# Patient Record
Sex: Female | Born: 1960
Health system: Southern US, Community
[De-identification: ages and names within clinical notes are randomized; demographics above are authoritative.]

## PROBLEM LIST (undated history)

## (undated) DIAGNOSIS — K219 Gastro-esophageal reflux disease without esophagitis: Secondary | ICD-10-CM

## (undated) DIAGNOSIS — R42 Dizziness and giddiness: Secondary | ICD-10-CM

## (undated) DIAGNOSIS — R51 Headache: Secondary | ICD-10-CM

## (undated) DIAGNOSIS — R519 Headache, unspecified: Secondary | ICD-10-CM

## (undated) HISTORY — PX: DILATION AND CURETTAGE OF UTERUS: SHX78

## (undated) HISTORY — DX: Headache: R51

## (undated) HISTORY — DX: Headache, unspecified: R51.9

## (undated) HISTORY — DX: Gastro-esophageal reflux disease without esophagitis: K21.9

---

## 1997-10-03 ENCOUNTER — Other Ambulatory Visit: Admission: RE | Admit: 1997-10-03 | Discharge: 1997-10-03 | Payer: Self-pay | Admitting: Obstetrics & Gynecology

## 1997-12-21 ENCOUNTER — Ambulatory Visit (HOSPITAL_COMMUNITY): Admission: RE | Admit: 1997-12-21 | Discharge: 1997-12-21 | Payer: Self-pay | Admitting: Emergency Medicine

## 1999-04-01 ENCOUNTER — Other Ambulatory Visit: Admission: RE | Admit: 1999-04-01 | Discharge: 1999-04-01 | Payer: Self-pay | Admitting: Obstetrics & Gynecology

## 2000-04-26 ENCOUNTER — Encounter: Admission: RE | Admit: 2000-04-26 | Discharge: 2000-04-26 | Payer: Self-pay | Admitting: Sports Medicine

## 2001-06-07 ENCOUNTER — Other Ambulatory Visit: Admission: RE | Admit: 2001-06-07 | Discharge: 2001-06-07 | Payer: Self-pay | Admitting: Obstetrics & Gynecology

## 2003-04-13 ENCOUNTER — Other Ambulatory Visit: Admission: RE | Admit: 2003-04-13 | Discharge: 2003-04-13 | Payer: Self-pay | Admitting: Obstetrics & Gynecology

## 2004-06-13 ENCOUNTER — Other Ambulatory Visit: Admission: RE | Admit: 2004-06-13 | Discharge: 2004-06-13 | Payer: Self-pay | Admitting: Obstetrics & Gynecology

## 2004-09-29 ENCOUNTER — Encounter: Admission: RE | Admit: 2004-09-29 | Discharge: 2004-09-29 | Payer: Self-pay | Admitting: Internal Medicine

## 2005-03-23 ENCOUNTER — Encounter: Admission: RE | Admit: 2005-03-23 | Discharge: 2005-03-23 | Payer: Self-pay | Admitting: Internal Medicine

## 2005-11-07 ENCOUNTER — Emergency Department (HOSPITAL_COMMUNITY): Admission: EM | Admit: 2005-11-07 | Discharge: 2005-11-07 | Payer: Self-pay | Admitting: Emergency Medicine

## 2007-06-24 ENCOUNTER — Encounter: Payer: Self-pay | Admitting: Family Medicine

## 2007-07-27 ENCOUNTER — Encounter: Payer: Self-pay | Admitting: Family Medicine

## 2010-09-22 ENCOUNTER — Other Ambulatory Visit: Payer: Self-pay | Admitting: Family Medicine

## 2010-09-22 ENCOUNTER — Ambulatory Visit
Admission: RE | Admit: 2010-09-22 | Discharge: 2010-09-22 | Disposition: A | Discharge status: Home / Self Care | Payer: 59 | Source: Ambulatory Visit | Attending: Family Medicine | Admitting: Family Medicine

## 2010-09-22 DIAGNOSIS — R42 Dizziness and giddiness: Secondary | ICD-10-CM

## 2010-09-26 ENCOUNTER — Other Ambulatory Visit: Payer: Self-pay | Admitting: Internal Medicine

## 2010-09-26 DIAGNOSIS — R42 Dizziness and giddiness: Secondary | ICD-10-CM

## 2010-09-29 ENCOUNTER — Ambulatory Visit
Admission: RE | Admit: 2010-09-29 | Discharge: 2010-09-29 | Disposition: A | Discharge status: Home / Self Care | Payer: 59 | Source: Ambulatory Visit | Attending: Internal Medicine | Admitting: Internal Medicine

## 2010-09-29 DIAGNOSIS — R42 Dizziness and giddiness: Secondary | ICD-10-CM

## 2010-09-29 MED ORDER — GADOBENATE DIMEGLUMINE 529 MG/ML IV SOLN
10.0000 mL | Freq: Once | INTRAVENOUS | Status: AC | PRN
  Administered 2010-09-29: 10 mL via INTRAVENOUS

## 2011-01-19 ENCOUNTER — Ambulatory Visit
Admission: RE | Admit: 2011-01-19 | Discharge: 2011-01-19 | Disposition: A | Discharge status: Home / Self Care | Payer: 59 | Source: Ambulatory Visit | Attending: Emergency Medicine | Admitting: Emergency Medicine

## 2011-01-19 ENCOUNTER — Other Ambulatory Visit: Payer: Self-pay | Admitting: Emergency Medicine

## 2011-01-19 DIAGNOSIS — R1011 Right upper quadrant pain: Secondary | ICD-10-CM

## 2011-01-29 ENCOUNTER — Other Ambulatory Visit: Payer: Self-pay | Admitting: Internal Medicine

## 2011-01-29 DIAGNOSIS — IMO0001 Reserved for inherently not codable concepts without codable children: Secondary | ICD-10-CM

## 2011-02-03 ENCOUNTER — Ambulatory Visit
Admission: RE | Admit: 2011-02-03 | Discharge: 2011-02-03 | Disposition: A | Discharge status: Home / Self Care | Payer: 59 | Source: Ambulatory Visit | Attending: Internal Medicine | Admitting: Internal Medicine

## 2011-02-03 DIAGNOSIS — IMO0001 Reserved for inherently not codable concepts without codable children: Secondary | ICD-10-CM

## 2011-11-08 ENCOUNTER — Ambulatory Visit (INDEPENDENT_AMBULATORY_CARE_PROVIDER_SITE_OTHER): Payer: Managed Care, Other (non HMO) | Admitting: Internal Medicine

## 2011-11-08 VITALS — BP 132/73 | HR 80 | Temp 99.3°F | Resp 16 | Ht 63.3 in | Wt 125.8 lb

## 2011-11-08 DIAGNOSIS — J309 Allergic rhinitis, unspecified: Secondary | ICD-10-CM | POA: Insufficient documentation

## 2011-11-08 DIAGNOSIS — J342 Deviated nasal septum: Secondary | ICD-10-CM | POA: Insufficient documentation

## 2011-11-08 DIAGNOSIS — Z780 Asymptomatic menopausal state: Secondary | ICD-10-CM | POA: Insufficient documentation

## 2011-11-08 DIAGNOSIS — R42 Dizziness and giddiness: Secondary | ICD-10-CM

## 2011-11-08 DIAGNOSIS — G4700 Insomnia, unspecified: Secondary | ICD-10-CM | POA: Insufficient documentation

## 2011-11-08 DIAGNOSIS — R5383 Other fatigue: Secondary | ICD-10-CM

## 2011-11-08 HISTORY — DX: Allergic rhinitis, unspecified: J30.9

## 2011-11-08 HISTORY — DX: Asymptomatic menopausal state: Z78.0

## 2011-11-08 HISTORY — DX: Deviated nasal septum: J34.2

## 2011-11-08 HISTORY — DX: Insomnia, unspecified: G47.00

## 2011-11-08 LAB — COMPREHENSIVE METABOLIC PANEL
ALT: 16 U/L (ref 0–35)
Albumin: 4.4 g/dL (ref 3.5–5.2)
BUN: 10 mg/dL (ref 6–23)
CO2: 29 mEq/L (ref 19–32)
Calcium: 9.5 mg/dL (ref 8.4–10.5)
Chloride: 104 mEq/L (ref 96–112)
Glucose, Bld: 103 mg/dL — ABNORMAL HIGH (ref 70–99)
Sodium: 141 mEq/L (ref 135–145)
Total Bilirubin: 0.4 mg/dL (ref 0.3–1.2)
Total Protein: 7 g/dL (ref 6.0–8.3)

## 2011-11-08 LAB — POCT URINALYSIS DIPSTICK
Bilirubin, UA: NEGATIVE
Blood, UA: NEGATIVE
Glucose, UA: NEGATIVE
Leukocytes, UA: NEGATIVE
Nitrite, UA: NEGATIVE
Protein, UA: NEGATIVE
Spec Grav, UA: 1.02
Urobilinogen, UA: 0.2
pH, UA: 7

## 2011-11-08 LAB — POCT UA - MICROSCOPIC ONLY
Bacteria, U Microscopic: NEGATIVE
Casts, Ur, LPF, POC: NEGATIVE
Crystals, Ur, HPF, POC: NEGATIVE
Yeast, UA: NEGATIVE

## 2011-11-08 LAB — POCT CBC
Granulocyte percent: 63.3 %G (ref 37–80)
HCT, POC: 42.8 % (ref 37.7–47.9)
Hemoglobin: 14.3 g/dL (ref 12.2–16.2)
Lymph, poc: 1.9 (ref 0.6–3.4)
MCH, POC: 30.2 pg (ref 27–31.2)
MCHC: 33.4 g/dL (ref 31.8–35.4)
MCV: 90.4 fL (ref 80–97)
MID (cbc): 0.3 (ref 0–0.9)
MPV: 8.7 fL (ref 0–99.8)
POC Granulocyte: 3.9 (ref 2–6.9)
POC LYMPH PERCENT: 31.4 %L (ref 10–50)
Platelet Count, POC: 220 10*3/uL (ref 142–424)
RBC: 4.73 M/uL (ref 4.04–5.48)
RDW, POC: 13.4 %
WBC: 6.1 10*3/uL (ref 4.6–10.2)

## 2011-11-08 LAB — POCT SEDIMENTATION RATE: POCT SED RATE: 20 mm/hr (ref 0–22)

## 2011-11-08 LAB — TSH: TSH: 1.119 u[IU]/mL (ref 0.350–4.500)

## 2011-11-08 MED ORDER — FLUTICASONE PROPIONATE 50 MCG/ACT NA SUSP: 2.0000 | Freq: Every day | NASAL | Status: DC

## 2011-11-08 NOTE — Patient Instructions (Signed)
Where did I leave my glasses Afrin on R-5d Then Flonase at least a month Valerian root 450-600 at bedtime???

## 2011-11-08 NOTE — Progress Notes (Signed)
Subjective:    Patient ID: Dana [name], female    DOB: 07/24/1960, 51 y.o.   MRN: [id number]  HPIFatigue for several months/this has been cyclic for years Dizzy spells-On and off for 4 weeks/Some episodes start doing exercise which is minimal Hot/sweats-Internet at 6 weeks Ears pop Nasal obstruction In a fog-When wakes up Falling asleep early and Sleeps poorly/Waking frequently and early Mild daytime somnolence Dr Dana [name] testing/hearing/"sounds" in ear-all thought WNL palpitations Hot and cold for years-more recently No sweating  Review of Systems  Constitutional: Positive for appetite change. Negative for fever, diaphoresis, activity change and unexpected weight change.  HENT: Negative for hearing loss, congestion, rhinorrhea, sneezing, trouble swallowing and postnasal drip.   Eyes: Negative for photophobia and visual disturbance.  Respiratory: Negative for apnea, cough, chest tightness and wheezing.   Cardiovascular: Positive for palpitations. Negative for chest pain and leg swelling.  Gastrointestinal: Negative for nausea, vomiting, abdominal pain, diarrhea and constipation.  Genitourinary: Negative for dysuria, frequency and difficulty urinating.  Musculoskeletal: Negative for back pain, joint swelling, arthralgias and gait problem.  Skin: Negative for rash.  Neurological: Positive for dizziness and headaches. Negative for weakness and numbness.  Hematological: Negative for adenopathy. Does not bruise/bleed easily.  Psychiatric/Behavioral: Positive for disturbed wake/sleep cycle. Negative for behavioral problems, dysphoric mood and agitation.   Ha as in past/minimal opthal wnl Neck tender--PT O'Halloran    Objective:   Physical Exam  Constitutional: She is oriented to person, place, and time. She appears well-developed and well-nourished.  HENT:  Right Ear: External ear normal.  Left Ear: External ear normal.  Nose: Nose normal.  Mouth/Throat: Oropharynx  is clear and moist.       TMs clear/canals clear  Right sided septal deviation with obstruction/no purulent discharge  Eyes: Conjunctivae and EOM are normal. Pupils are equal, round, and reactive to light.  Neck: Normal range of motion. Neck supple. No thyromegaly present.  Cardiovascular: Normal rate, regular rhythm, normal heart sounds and intact distal pulses.   No murmur heard. Pulmonary/Chest: Effort normal and breath sounds normal.  Abdominal: Soft. Bowel sounds are normal. She exhibits no mass. There is no tenderness.       No organomegaly  Musculoskeletal: Normal range of motion. She exhibits no edema.  Lymphadenopathy:    She has no cervical adenopathy.  Neurological: She is alert and oriented to person, place, and time. She has normal reflexes.  Skin: No rash noted. No pallor.  Psychiatric: She has a normal mood and affect. Her behavior is normal. Judgment and thought content normal.       Denies anxiety/ depression          Results for orders placed in visit on 11/08/11  POCT CBC      Component Value Range   WBC 6.1  4.6 - 10.2 K/uL   Lymph, poc 1.9  0.6 - 3.4   POC LYMPH PERCENT 31.4  10 - 50 %L   MID (cbc) 0.3  0 - 0.9   POC MID % 5.3  0 - 12 %M   POC Granulocyte 3.9  2 - 6.9   Granulocyte percent 63.3  37 - 80 %G   RBC 4.73  4.04 - 5.48 M/uL   Hemoglobin 14.3  12.2 - 16.2 g/dL   HCT, POC 82.9  56.2 - 47.9 %   MCV 90.4  80 - 97 fL   MCH, POC 30.2  27 - 31.2 pg   MCHC 33.4  31.8 - 35.4 g/dL  RDW, POC 13.4     Platelet Count, POC 220  142 - 424 K/uL   MPV 8.7  0 - 99.8 fL  POCT URINALYSIS DIPSTICK      Component Value Range   Color, UA yellow     Clarity, UA clear     Glucose, UA neg     Bilirubin, UA neg     Ketones, UA neg     Spec Grav, UA 1.020     Blood, UA neg     pH, UA 7.0     Protein, UA neg     Urobilinogen, UA 0.2     Nitrite, UA neg     Leukocytes, UA Negative    POCT UA - MICROSCOPIC ONLY      Component Value Range   WBC, Ur, HPF,  POC neg     RBC, urine, microscopic neg     Bacteria, U Microscopic neg     Mucus, UA neg     Epithelial cells, urine per micros 0-3     Crystals, Ur, HPF, POC neg     Casts, Ur, LPF, POC neg     Yeast, UA neg      Assessment & Plan:   1. Fatigue  POCT CBC, POCT SEDIMENTATION RATE, Comprehensive metabolic panel, Vitamin D, 25-hydroxy, TSH, POCT urinalysis dipstick, POCT UA - Microscopic Only  2. Dizzy  POCT CBC  3. Nasal septal deviation    4. Insomnia -Poor sleep   5. AR (allergic rhinitis)    6.  Vitamin D deficiency in the past  Consider chronic sleep deprivation/dietary insufficiency/some type of chronic fatigue syndrome Trial of Flonase to see if we can improve breathing at night Begin gentle exercise program at home Patient Instructions  Where did I leave my glasses Afrin on R-5d Then Flonase at least a month Valerian root 450-600 at bedtime???

## 2011-11-09 ENCOUNTER — Telehealth: Payer: Self-pay | Admitting: Internal Medicine

## 2011-11-09 LAB — VITAMIN D 25 HYDROXY (VIT D DEFICIENCY, FRACTURES): Vit D, 25-Hydroxy: 40 ng/mL (ref 30–89)

## 2011-11-09 NOTE — Telephone Encounter (Signed)
Discussed normal labs and proceeding with the plan as outlined Will consider medication if sleep problems cannot be solved She will attend the seminar on dizziness

## 2012-04-17 ENCOUNTER — Encounter: Payer: Self-pay | Admitting: Family Medicine

## 2012-04-28 ENCOUNTER — Observation Stay (HOSPITAL_COMMUNITY)
Admission: EM | Admit: 2012-04-28 | Discharge: 2012-04-28 | Disposition: A | Discharge status: Home / Self Care | Payer: 59 | Attending: Emergency Medicine | Admitting: Emergency Medicine

## 2012-04-28 ENCOUNTER — Encounter (HOSPITAL_COMMUNITY): Payer: Self-pay | Admitting: Emergency Medicine

## 2012-04-28 ENCOUNTER — Observation Stay (HOSPITAL_COMMUNITY): Payer: 59

## 2012-04-28 DIAGNOSIS — R1110 Vomiting, unspecified: Secondary | ICD-10-CM | POA: Insufficient documentation

## 2012-04-28 DIAGNOSIS — R42 Dizziness and giddiness: Principal | ICD-10-CM | POA: Insufficient documentation

## 2012-04-28 HISTORY — DX: Dizziness and giddiness: R42

## 2012-04-28 LAB — CBC WITH DIFFERENTIAL/PLATELET
Basophils Absolute: 0 10*3/uL (ref 0.0–0.1)
Eosinophils Absolute: 0.1 10*3/uL (ref 0.0–0.7)
Eosinophils Relative: 1 % (ref 0–5)
HCT: 37.1 % (ref 36.0–46.0)
Hemoglobin: 13.4 g/dL (ref 12.0–15.0)
Lymphocytes Relative: 21 % (ref 12–46)
Lymphs Abs: 1.1 10*3/uL (ref 0.7–4.0)
MCH: 31 pg (ref 26.0–34.0)
MCHC: 36.1 g/dL — ABNORMAL HIGH (ref 30.0–36.0)
MCV: 85.9 fL (ref 78.0–100.0)
Neutro Abs: 4.1 10*3/uL (ref 1.7–7.7)
Neutrophils Relative %: 73 % (ref 43–77)
Platelets: 158 10*3/uL (ref 150–400)
RBC: 4.32 MIL/uL (ref 3.87–5.11)
RDW: 12.4 % (ref 11.5–15.5)
WBC: 5.6 10*3/uL (ref 4.0–10.5)

## 2012-04-28 LAB — COMPREHENSIVE METABOLIC PANEL
ALT: 17 U/L (ref 0–35)
AST: 22 U/L (ref 0–37)
Albumin: 3.8 g/dL (ref 3.5–5.2)
Alkaline Phosphatase: 56 U/L (ref 39–117)
BUN: 13 mg/dL (ref 6–23)
CO2: 27 mEq/L (ref 19–32)
Calcium: 9.7 mg/dL (ref 8.4–10.5)
Chloride: 101 mEq/L (ref 96–112)
Creatinine, Ser: 0.76 mg/dL (ref 0.50–1.10)
GFR calc Af Amer: >90 mL/min (ref >90)
GFR calc non Af Amer: >90 mL/min (ref >90)
Glucose, Bld: 124 mg/dL — ABNORMAL HIGH (ref 70–99)
Potassium: 3.8 mEq/L (ref 3.5–5.1)
Sodium: 137 mEq/L (ref 135–145)
Total Bilirubin: 0.3 mg/dL (ref 0.3–1.2)
Total Protein: 7.1 g/dL (ref 6.0–8.3)

## 2012-04-28 LAB — RAPID URINE DRUG SCREEN, HOSP PERFORMED
Amphetamines: NOT DETECTED
Barbiturates: NOT DETECTED
Benzodiazepines: NOT DETECTED
Cocaine: NOT DETECTED
Opiates: NOT DETECTED

## 2012-04-28 MED ORDER — LORAZEPAM 2 MG/ML IJ SOLN
1.0000 mg | Freq: Once | INTRAMUSCULAR | Status: DC
  Filled 2012-04-28: qty 1

## 2012-04-28 MED ORDER — MECLIZINE HCL 50 MG PO TABS: 50.0000 mg | ORAL_TABLET | Freq: Three times a day (TID) | ORAL | Status: DC | PRN

## 2012-04-28 MED ORDER — ONDANSETRON HCL 4 MG/2ML IJ SOLN
4.0000 mg | Freq: Once | INTRAMUSCULAR | Status: AC
  Administered 2012-04-28: 4 mg via INTRAVENOUS
  Filled 2012-04-28: qty 2

## 2012-04-28 MED ORDER — SODIUM CHLORIDE 0.9 % IV BOLUS (SEPSIS)
1000.0000 mL | Freq: Once | INTRAVENOUS | Status: AC
  Administered 2012-04-28: 1000 mL via INTRAVENOUS

## 2012-04-28 MED ORDER — MECLIZINE HCL 25 MG PO TABS
25.0000 mg | ORAL_TABLET | Freq: Once | ORAL | Status: AC
  Administered 2012-04-28: 25 mg via ORAL
  Filled 2012-04-28: qty 1

## 2012-04-28 NOTE — ED Notes (Signed)
Report given to RN [facility]

## 2012-04-28 NOTE — ED Notes (Signed)
Pt aware of need for urine sample.  Neuro PA at bedside.

## 2012-04-28 NOTE — ED Notes (Signed)
Pt unsure of taking Ativan.  PA notified.  MD at bedside.

## 2012-04-28 NOTE — ED Notes (Signed)
Pt A&Ox4, ambulatory, nad. Pt c/o lightheadedness, but is able to ambulate w/o difficulty.

## 2012-04-28 NOTE — ED Notes (Signed)
Provider at bedside

## 2012-04-28 NOTE — ED Notes (Signed)
Neuro MD continues at bedside.

## 2012-04-28 NOTE — ED Provider Notes (Signed)
Medical screening examination/treatment/procedure(s) were conducted as a shared visit with non-physician practitioner(s) and myself.  I personally evaluated the patient during the encounter.  Nsurgery consulted for dizziness, vertigo - concerns for possible TIA/stroke. She had MRI earlier, that was negative. I dont see any reason to get MRI right now, however, with persistent sx of dizziness, with normal neuro exam, no risk factors - we will get Neuro on board for their recs rather than get MRI right off.  Derwood Kaplan, MD 04/28/12 [phone]

## 2012-04-28 NOTE — ED Provider Notes (Signed)
History     CSN: [id number]  Arrival date & time 04/28/12  5621   First MD Initiated Contact with Patient 04/28/12 1222      Chief Complaint  Patient presents with  . Dizziness  . Emesis    (Consider location/radiation/quality/duration/timing/severity/associated sxs/prior treatment) HPI  51 year old female with history of vertigo, status post negative brain MRI in 2012 presents complaining of dizziness.  Pt reports when she got up and get out of bed this AM she experienced acute onset of dizziness (room spinning around), which worsen when she turned her head.  She fell, and hit R forearm against the wall.  Did not hits head of LOC.  Sxs lasting < 30 min and resolved.  She proceed to take a shower and after getting out of the the shower she once again experienced dizziness, especially as she lays in her bed.  She then decided to come to ER for further care.  While in the waiting room, she felt nauseated and proceed to vomit once, having difficulty standing.  She endorse a mild headache but sts she has hx of migraine and this headache is the same.  Otherwise denies fever, chills, neck stiffness, cp, sob, abd pain, numbness or weakness.  Denies recent sickness, medication changes, ear pain, hearing changes.  No treatment tried.    Past Medical History  Diagnosis Date  . Vertigo     No past surgical history on file.  No family history on file.  History  Substance Use Topics  . Smoking status: Never Smoker   . Smokeless tobacco: Not on file  . Alcohol Use: Not on file    OB History    Grav Para Term Preterm Abortions TAB SAB Ect Mult Living                  Review of Systems  All other systems reviewed and are negative.    Allergies  Prednisone  Home Medications   Current Outpatient Rx  Name  Route  Sig  Dispense  Refill  . FLUTICASONE PROPIONATE 50 MCG/ACT NA SUSP   Nasal   Place 2 sprays into the nose daily.   16 g   6   . LORATADINE 10 MG PO TABS   Oral  Take 10 mg by mouth daily.           BP 108/67  Pulse 70  Temp 98 F (36.7 C) (Oral)  Resp 18  SpO2 99%  Physical Exam  Nursing note and vitals reviewed. Constitutional: She is oriented to person, place, and time. She appears well-developed and well-nourished. No distress.       Awake, alert, nontoxic appearance  HENT:  Head: Normocephalic and atraumatic.  Nose: Nose normal.  Mouth/Throat: Oropharynx is clear and moist. No oropharyngeal exudate.       TM appears dull bilaterally without overt signs of infection.    Eyes: Conjunctivae normal and EOM are normal. Pupils are equal, round, and reactive to light. Right eye exhibits no discharge. Left eye exhibits no discharge.       Fatigable horizontal nystagmus favoring the left side.    Neck: Normal range of motion. Neck supple.  Cardiovascular: Normal rate and regular rhythm.  Exam reveals no gallop and no friction rub.   No murmur heard. Pulmonary/Chest: Effort normal. No respiratory distress. She exhibits no tenderness.  Abdominal: Soft. There is no tenderness. There is no rebound.  Musculoskeletal: Normal range of motion. She exhibits no edema and no tenderness.  ROM appears intact, no obvious focal weakness  Neurological: She is alert and oriented to person, place, and time. She has normal strength and normal reflexes. She displays normal reflexes. No cranial nerve deficit. She exhibits normal muscle tone. Coordination normal. GCS eye subscore is 4. GCS verbal subscore is 5. GCS motor subscore is 6.       Mental status and motor strength appears intact.  Normal gait.  Normal finger to nose, heel shin, Romberg negative.  Skin: Skin is warm. No rash noted.  Psychiatric: She has a normal mood and affect.    ED Course  Procedures (including critical care time)  Labs Reviewed  CBC WITH DIFFERENTIAL - Abnormal; Notable for the following:    MCHC 36.1 (*)     All other components within normal limits  COMPREHENSIVE  METABOLIC PANEL - Abnormal; Notable for the following:    Glucose, Bld 124 (*)     All other components within normal limits   Results for orders placed during the hospital encounter of 04/28/12  CBC WITH DIFFERENTIAL      Component Value Range   WBC 5.6  4.0 - 10.5 K/uL   RBC 4.32  3.87 - 5.11 MIL/uL   Hemoglobin 13.4  12.0 - 15.0 g/dL   HCT 40.9  81.1 - 91.4 %   MCV 85.9  78.0 - 100.0 fL   MCH 31.0  26.0 - 34.0 pg   MCHC 36.1 (*) 30.0 - 36.0 g/dL   RDW 78.2  95.6 - 21.3 %   Platelets 158  150 - 400 K/uL   Neutrophils Relative 73  43 - 77 %   Neutro Abs 4.1  1.7 - 7.7 K/uL   Lymphocytes Relative 21  12 - 46 %   Lymphs Abs 1.1  0.7 - 4.0 K/uL   Monocytes Relative 5  3 - 12 %   Monocytes Absolute 0.3  0.1 - 1.0 K/uL   Eosinophils Relative 1  0 - 5 %   Eosinophils Absolute 0.1  0.0 - 0.7 K/uL   Basophils Relative 1  0 - 1 %   Basophils Absolute 0.0  0.0 - 0.1 K/uL  COMPREHENSIVE METABOLIC PANEL      Component Value Range   Sodium 137  135 - 145 mEq/L   Potassium 3.8  3.5 - 5.1 mEq/L   Chloride 101  96 - 112 mEq/L   CO2 27  19 - 32 mEq/L   Glucose, Bld 124 (*) 70 - 99 mg/dL   BUN 13  6 - 23 mg/dL   Creatinine, Ser 0.86  0.50 - 1.10 mg/dL   Calcium 9.7  8.4 - 57.8 mg/dL   Total Protein 7.1  6.0 - 8.3 g/dL   Albumin 3.8  3.5 - 5.2 g/dL   AST 22  0 - 37 U/L   ALT 17  0 - 35 U/L   Alkaline Phosphatase 56  39 - 117 U/L   Total Bilirubin 0.3  0.3 - 1.2 mg/dL   GFR calc non Af Amer >90  >90 mL/min   GFR calc Af Amer >90  >90 mL/min   No results found.  1. dizziness  MDM  Pt presents with vertigo suggestive of BPPV.  Currently without significant dizziness but endorse nausea.  Meclizine given.  She has no focal neuro deficits.  In the setting of a normal brain MRI last year, my suspicion for central vertigo is low.    Attempt of dix-hallpike and appley maneuver without relief  of symptom.    2:06 PM Pt reports no improvement after treatment with meclizine. Ativan IV given,  will continue to monitor.    2:53 PM My attending has evaluated pt and recommend consult neurology for further care.    3:15 PM Neurology, Dr. Thad Ranger has been consulted and agrees to see pt in ED for further management.  Pt aware of plan.  Pt currently resting without acute distress.  Family at bedside.    4:45 PM Pt move to CDU while awaiting for neuro consult.  If no further testing, pt may benefit from meclizine and ENT f/u.  Care discussed with CDU PA who will continue care.  My attending is aware.   BP 108/67  Pulse 76  Temp 98 F (36.7 C) (Oral)  Resp 18  SpO2 99%  I have reviewed nursing notes and vital signs. I personally reviewed the imaging tests through PACS system  I reviewed available ER/hospitalization records thought the EMR\    Fayrene Helper, PA-C 04/28/12 1646

## 2012-04-28 NOTE — ED Provider Notes (Signed)
Medical screening examination/treatment/procedure(s) were performed by non-physician practitioner and as supervising physician I was immediately available for consultation/collaboration.   Sarkis Rhines, MD 04/28/12 2328 

## 2012-04-28 NOTE — ED Provider Notes (Signed)
Pt transferred to the CDU awaiting evaluation by Dr Thad Ranger.  Pt c/o dizziness with horizontal nystagmus on exam. She had MRI earlier, that was negative.  She was evaluated by the neurology PA who is going to have Dr Thad Ranger evaluate.  Plan: Nsurgery consulted for dizziness, vertigo - concerns for possible TIA/stroke by Dr Derwood Kaplan and with persistent sx of dizziness, with normal neuro exam, no risk factors - we will get Neuro on board for their recs rather than get MRI immediatly.  BP 130/68  Pulse 95  Temp 98 F (36.7 C) (Oral)  Resp 18  SpO2 99%   On exam: hemodynamically stable, NAD, heart w/ RRR, lungs CTAB, Chest & abd non-tender, no peripheral edema or calf tenderness.  Pt continuing to c/o dizziness.  Given meclazine without relief.    7:00 PM  Recommendations from Dr Thad Ranger:  1. MRI/A of the brain without contrast  2. If above unremarkable may be discharged to home with Meclizine to use prn. Otherwise appropriate treatment to be initiated.    8:45 PM  MRI: Without acute intracranial hemorrhage, hydrocephalus or acute infarct MRA: No medium or large sized vessel significant stenosis or occlusion  Based on recommendations from Dr. Thad Ranger will discharge home with meclizine to use when necessary and have her followup outpatient.  Patient ambulates in the department without difficulty.  1. Medications: Meclizine, usual home medications 2. Treatment: rest, drink plenty of fluids, take medications as prescribed 3. Follow Up: Please followup with your primary doctor for discussion of your diagnoses and further evaluation after today's visit; if you do not have a primary care doctor use the resource guide provided to find one; followup with neurology about today's symptoms   Dierdre Forth, PA-C 04/28/12 2324

## 2012-04-28 NOTE — ED Notes (Signed)
MD Reynolds-Neuro MD at bedside.

## 2012-04-28 NOTE — ED Notes (Signed)
Pt ambulated to and from RR with husband.  Pt reports feeling no better.  PA notified.

## 2012-04-28 NOTE — ED Notes (Signed)
Pt reports dizziness this AM upon waking. Pt also reports after getting out of the shower she felt hot and was spinning. States just vomited in waiting room. States she feels unstable while standing up.

## 2012-04-28 NOTE — Consult Note (Signed)
NEURO HOSPITALIST CONSULT NOTE    Reason for Consult: vertigo  HPI:                                                                                                                                          Dana [name] is an 51 y.o. female who has history of syncope, nasal obstruction, palpitations who has been seen 2 years ago by Dr. Sandria Manly and Dr. Annalee Genta (ENT) for vertigo.  At that time she sates she had a MRI of her brain which showed small white matter lesions on in the right frontal and temporal lobes and likely chronic.  no stroke noted at that time. Per patient, echo was also performed which was normal. She was also seen by ENT who did not find any etiology for her symptoms. She was placed on no medication and has been doing fairly well until this AM when she had gotten up and walked downstairs.  She turned to the right and had a episode in which she lost balance and had to lean up to the wall on the right. This quickly resolved.  She later took a hot shower and then walked to her bed.  She laid down for a brief moment.  She had a sudden onset of acute left to right vertigo, flushed and hot sensation with nausea that lasted for a few minutes.  After this cleared she had no further vertigo but also did not feel back to her baseline. At present time she still feels "off" but has no vertigo.  She was given Meclizine and Ativan since in ED--she feels this may have worsened her "off" sensation.  Patient denies any paresthesia, neck pain, lateralizing weakness.   Of note: over last week she has noted a rushing sound in her left ear. Patient also states she often will have dizziness when she stands up quickly but not usually associated with vertigo.   Past Medical History  Diagnosis Date  . Vertigo     No past surgical history on file.  No family history on file.  Family History: Mother HTN, High cholesterol, depression Father CAD, HTN, Hyperlipidemia.  Social History:   she has never smoked. She does not have any smokeless tobacco history on file. Her alcohol and drug histories not on file.  Allergies  Allergen Reactions  . Prednisone     Feels like skin crawls    MEDICATIONS:  Current Facility-Administered Medications  Medication Dose Route Frequency Provider Last Rate Last Dose  . LORazepam (ATIVAN) injection 1 mg  1 mg Intravenous Once Fayrene Helper, PA-C      . [COMPLETED] meclizine (ANTIVERT) tablet 25 mg  25 mg Oral Once Fayrene Helper, PA-C   25 mg at 04/28/12 1242  . [COMPLETED] ondansetron (ZOFRAN) injection 4 mg  4 mg Intravenous Once Fayrene Helper, PA-C   4 mg at 04/28/12 1302  . [COMPLETED] sodium chloride 0.9 % bolus 1,000 mL  1,000 mL Intravenous Once Fayrene Helper, PA-C   1,000 mL at 04/28/12 1302   Current Outpatient Prescriptions  Medication Sig Dispense Refill  . loratadine (CLARITIN) 10 MG tablet Take 10 mg by mouth daily.         ROS:                                                                                                                                       History obtained from the patient  General ROS: negative for - chills, fatigue, fever, night sweats, weight gain or weight loss Psychological ROS: negative for - behavioral disorder, hallucinations, memory difficulties, mood swings or suicidal ideation Ophthalmic ROS: negative for - blurry vision, double vision, eye pain or loss of vision ENT ROS: positive for -  tinnitus or vertigo Allergy and Immunology ROS: negative for - hives or itchy/watery eyes Hematological and Lymphatic ROS: negative for - bleeding problems, bruising or swollen lymph nodes Endocrine ROS: negative for - galactorrhea, hair pattern changes, polydipsia/polyuria or temperature intolerance Respiratory ROS: negative for - cough, hemoptysis, shortness of breath or wheezing Cardiovascular ROS:  negative for - chest pain, dyspnea on exertion, edema or irregular heartbeat Gastrointestinal ROS: negative for - abdominal pain, diarrhea, hematemesis, nausea/vomiting or stool incontinence Genito-Urinary ROS: negative for - dysuria, hematuria, incontinence or urinary frequency/urgency Musculoskeletal ROS: negative for - joint swelling or muscular weakness Neurological ROS: as noted in HPI Dermatological ROS: negative for rash and skin lesion changes   Blood pressure 108/67, pulse 76, temperature 98 F (36.7 C), temperature source Oral, resp. rate 18, SpO2 99.00%.   Neurologic Examination:                                                                                                      Mental Status: Alert, oriented, thought content appropriate.  Speech fluent without evidence of aphasia.  Able to follow 3 step commands without difficulty. Cranial Nerves: II: Discs flat bilaterally; Visual fields grossly  normal, pupils equal, round, reactive to light and accommodation III,IV, VI: ptosis not present, extra-ocular motions intact bilaterally.  Nystagmus bilaterally which is horizontal. V,VII: smile symmetric, facial light touch sensation normal bilaterally VIII: hearing normal bilaterally IX,X: gag reflex present XI: bilateral shoulder shrug XII: midline tongue extension Motor: Right : Upper extremity   5/5    Left:     Upper extremity   5/5  Lower extremity   5/5     Lower extremity   5/5 Tone and bulk:normal tone throughout; no atrophy noted Sensory: Pinprick and light touch intact throughout, bilaterally Deep Tendon Reflexes: 2+ and symmetric throughout Plantars: Right: downgoing   Left: downgoing Cerebellar: normal finger-to-nose,  normal heel-to-shin test  Gait: HSH normal, walking on toes normal, heel walking normal.  On standing becomes acutely off balance CV: pulses palpable throughout      No results found for this basename: cbc, bmp, coags, chol, tri, ldl, hga1c     Results for orders placed during the hospital encounter of 04/28/12 (from the past 48 hour(s))  CBC WITH DIFFERENTIAL     Status: Abnormal   Collection Time   04/28/12 10:27 AM      Component Value Range Comment   WBC 5.6  4.0 - 10.5 K/uL    RBC 4.32  3.87 - 5.11 MIL/uL    Hemoglobin 13.4  12.0 - 15.0 g/dL    HCT 40.9  81.1 - 91.4 %    MCV 85.9  78.0 - 100.0 fL    MCH 31.0  26.0 - 34.0 pg    MCHC 36.1 (*) 30.0 - 36.0 g/dL    RDW 78.2  95.6 - 21.3 %    Platelets 158  150 - 400 K/uL    Neutrophils Relative 73  43 - 77 %    Neutro Abs 4.1  1.7 - 7.7 K/uL    Lymphocytes Relative 21  12 - 46 %    Lymphs Abs 1.1  0.7 - 4.0 K/uL    Monocytes Relative 5  3 - 12 %    Monocytes Absolute 0.3  0.1 - 1.0 K/uL    Eosinophils Relative 1  0 - 5 %    Eosinophils Absolute 0.1  0.0 - 0.7 K/uL    Basophils Relative 1  0 - 1 %    Basophils Absolute 0.0  0.0 - 0.1 K/uL   COMPREHENSIVE METABOLIC PANEL     Status: Abnormal   Collection Time   04/28/12 10:27 AM      Component Value Range Comment   Sodium 137  135 - 145 mEq/L    Potassium 3.8  3.5 - 5.1 mEq/L    Chloride 101  96 - 112 mEq/L    CO2 27  19 - 32 mEq/L    Glucose, Bld 124 (*) 70 - 99 mg/dL    BUN 13  6 - 23 mg/dL    Creatinine, Ser 0.86  0.50 - 1.10 mg/dL    Calcium 9.7  8.4 - 57.8 mg/dL    Total Protein 7.1  6.0 - 8.3 g/dL    Albumin 3.8  3.5 - 5.2 g/dL    AST 22  0 - 37 U/L    ALT 17  0 - 35 U/L    Alkaline Phosphatase 56  39 - 117 U/L    Total Bilirubin 0.3  0.3 - 1.2 mg/dL    GFR calc non Af Amer >90  >90 mL/min    GFR calc Af Amer >  90  >90 mL/min     Felicie Morn PA-C [facility] [phone]  04/28/2012, 4:12 PM Patient seen and examined.  Clinical course and management discussed.  Necessary edits performed.  I agree with the above.  Assessment and plan of care developed and discussed below.     Assessment/Plan: 51 year old female with no risk factors for stroke who presents with vertigo and vomiting.   Symptoms have for the most part resolved but patient still with nystagmus and off balance to some degree with walking.  Complains of symptoms starting with an occipital headache.  Would rule out posterior circulation stroke vs. Dissection.  Patient has had BPV in the past cut current symptoms are very different.  Recommendations: 1. MRI/A of the brain without contrast 2. If above unremarkable may be discharged to home with Meclizine to use prn.  Otherwise appropriate treatment to be initiated.    Case discussed with PA  Thana Farr, MD [facility] [phone]  04/28/2012  6:45 PM

## 2012-12-22 ENCOUNTER — Other Ambulatory Visit: Payer: Self-pay | Admitting: Internal Medicine

## 2012-12-22 DIAGNOSIS — R1013 Epigastric pain: Secondary | ICD-10-CM

## 2012-12-22 MED ORDER — DICYCLOMINE HCL 10 MG PO CAPS: 10.0000 mg | ORAL_CAPSULE | Freq: Three times a day (TID) | ORAL | Status: DC

## 2012-12-22 NOTE — Progress Notes (Signed)
[place]---reflux nausea bloating p meals -no chg w/ nexium 10d Happened 18mos ago and took 2 months to get well  Plan--add to nexium Meds ordered this encounter  Medications  . dicyclomine (BENTYL) 10 MG capsule    Sig: Take 1 capsule (10 mg total) by mouth 4 (four) times daily -  before meals and at bedtime.    Dispense:  30 capsule    Refill:  0  f/u 2-3 weeks if not well

## 2013-01-04 ENCOUNTER — Encounter: Payer: Self-pay | Admitting: Internal Medicine

## 2013-01-04 ENCOUNTER — Ambulatory Visit (INDEPENDENT_AMBULATORY_CARE_PROVIDER_SITE_OTHER): Payer: 59 | Admitting: Internal Medicine

## 2013-01-04 VITALS — BP 119/73 | HR 80 | Temp 97.4°F | Resp 16 | Ht 63.5 in | Wt 124.0 lb

## 2013-01-04 DIAGNOSIS — G8929 Other chronic pain: Secondary | ICD-10-CM

## 2013-01-04 DIAGNOSIS — R1013 Epigastric pain: Secondary | ICD-10-CM

## 2013-01-04 DIAGNOSIS — R630 Anorexia: Secondary | ICD-10-CM

## 2013-01-04 LAB — COMPREHENSIVE METABOLIC PANEL
BUN: 9 mg/dL (ref 6–23)
CO2: 28 mEq/L (ref 19–32)
Creat: 0.76 mg/dL (ref 0.50–1.10)
Glucose, Bld: 102 mg/dL — ABNORMAL HIGH (ref 70–99)
Total Bilirubin: 0.7 mg/dL (ref 0.3–1.2)
Total Protein: 7.3 g/dL (ref 6.0–8.3)

## 2013-01-04 LAB — CBC WITH DIFFERENTIAL/PLATELET
Eosinophils Absolute: 0 10*3/uL (ref 0.0–0.7)
Eosinophils Relative: 1 % (ref 0–5)
Hemoglobin: 14 g/dL (ref 12.0–15.0)
Lymphs Abs: 1.5 10*3/uL (ref 0.7–4.0)
MCH: 30.7 pg (ref 26.0–34.0)
MCV: 83.8 fL (ref 78.0–100.0)
Monocytes Relative: 8 % (ref 3–12)
RBC: 4.56 MIL/uL (ref 3.87–5.11)

## 2013-01-04 LAB — POCT SEDIMENTATION RATE: POCT SED RATE: 17 mm/hr (ref 0–22)

## 2013-01-04 MED ORDER — ESOMEPRAZOLE MAGNESIUM 40 MG PO CPDR: 40.0000 mg | DELAYED_RELEASE_CAPSULE | Freq: Every day | ORAL | Status: DC

## 2013-01-04 NOTE — Progress Notes (Signed)
Subjective:    Patient ID: Dana [name], female    DOB: 1960/07/22, 52 y.o.   MRN: [id number]  HPI 6 weeks ago she ingested spicy chili and ever since has had problems with loss of appetite, burping frequently, inability to eat without increasing symptoms, epigastric discomfort without burning or nocturnal symptoms, and a general feeling of disease with regard to her abdominal area. This is not effective bowel movements. She has lost 3 pounds. There are no chills fever or night sweats. No blood in the bowel movements. She had a similar problem 2 years ago and was diagnosed after upper GI and evaluation by Dr. Arlyce Dice with esophageal reflux. She slowly responded to Nexium 40 mg over 1-2 months and has been stable since off meds. She restarted Nexium 3 or 4 weeks ago at 20 mg and this has had no effect. Bentyl 10 mg was called in but she was afraid to start this due to the side effect profile.   It is significant that the youngest of her 3 children is leaving for college this week   Review of Systems  Constitutional: Positive for appetite change. Negative for fever, chills, activity change, fatigue and unexpected weight change.  Respiratory: Negative for cough, choking, chest tightness and shortness of breath.   Cardiovascular: Negative for chest pain, palpitations and leg swelling.  Gastrointestinal: Negative for nausea, vomiting, diarrhea, constipation, blood in stool, abdominal distention, anal bleeding and rectal pain.  Genitourinary: Negative for dysuria, frequency, menstrual problem, pelvic pain and dyspareunia.  Musculoskeletal: Negative for arthralgias.  Psychiatric/Behavioral: Negative for dysphoric mood. The patient is not nervous/anxious.        Objective:   Physical Exam BP 119/73  Pulse 80  Temp(Src) 97.4 F (36.3 C) (Oral)  Resp 16  Ht 5' 3.5" (1.613 m)  Wt 124 lb (56.246 kg)  BMI 21.62 kg/m2 HEENT clear Heart regular without murmur Lungs clear Abdomen supple with  normal bowel sounds/no organomegaly/no masses/mildly tender over the epigastrium and. Umbilical areas Extremities clear  Results for orders placed in visit on 01/04/13  CBC WITH DIFFERENTIAL      Result Value Range   WBC 4.0  4.0 - 10.5 K/uL   RBC 4.56  3.87 - 5.11 MIL/uL   Hemoglobin 14.0  12.0 - 15.0 g/dL   HCT 40.9  81.1 - 91.4 %   MCV 83.8  78.0 - 100.0 fL   MCH 30.7  26.0 - 34.0 pg   MCHC 36.6 (*) 30.0 - 36.0 g/dL   RDW 78.2  95.6 - 21.3 %   Platelets 183  150 - 400 K/uL   Neutrophils Relative % 54  43 - 77 %   Neutro Abs 2.2  1.7 - 7.7 K/uL   Lymphocytes Relative 36  12 - 46 %   Lymphs Abs 1.5  0.7 - 4.0 K/uL   Monocytes Relative 8  3 - 12 %   Monocytes Absolute 0.3  0.1 - 1.0 K/uL   Eosinophils Relative 1  0 - 5 %   Eosinophils Absolute 0.0  0.0 - 0.7 K/uL   Basophils Relative 1  0 - 1 %   Basophils Absolute 0.0  0.0 - 0.1 K/uL   Smear Review Criteria for review not met    COMPREHENSIVE METABOLIC PANEL      Result Value Range   Sodium 138  135 - 145 mEq/L   Potassium 3.9  3.5 - 5.3 mEq/L   Chloride 102  96 - 112 mEq/L  CO2 28  19 - 32 mEq/L   Glucose, Bld 102 (*) 70 - 99 mg/dL   BUN 9  6 - 23 mg/dL   Creat 4.09  8.11 - 9.14 mg/dL   Total Bilirubin 0.7  0.3 - 1.2 mg/dL   Alkaline Phosphatase 56  39 - 117 U/L   AST 24  0 - 37 U/L   ALT 18  0 - 35 U/L   Total Protein 7.3  6.0 - 8.3 g/dL   Albumin 4.6  3.5 - 5.2 g/dL   Calcium 9.9  8.4 - 78.2 mg/dL  POCT SEDIMENTATION RATE      Result Value Range   POCT SED RATE 17  0 - 22 mm/hr       Assessment & Plan:  Dyspepsia Abdominal pain, chronic, epigastric - Appetite loss  Restart nexium at 40mg  Align Use bentyl prn F/u dr Arlyce Dice 2 weeks as planned

## 2013-01-06 ENCOUNTER — Telehealth: Payer: Self-pay | Admitting: Radiology

## 2013-01-06 NOTE — Telephone Encounter (Signed)
Prior Dana [name] was sent. Additional information is needed there is a list of questions needed they are sending the additional form to your attention.

## 2013-01-12 NOTE — Telephone Encounter (Signed)
Called to check status of PA, have not received any other questions. I completed other questions over the phone and PA was denied. They will fax denial w/covered alternatives. Notified pt of status and she agreed to try one of the covered alternatives.

## 2013-01-13 NOTE — Telephone Encounter (Signed)
Dr Merla Riches, no specific alternatives were listed on denial letter, can you Rx one of the generic PPIs for the pt to try?

## 2013-01-13 NOTE — Telephone Encounter (Signed)
Omeprazole 40 daily  W/ a meal #30 2 ref

## 2013-01-16 MED ORDER — OMEPRAZOLE 40 MG PO CPDR: 40.0000 mg | DELAYED_RELEASE_CAPSULE | Freq: Every day | ORAL | Status: DC

## 2013-01-16 NOTE — Telephone Encounter (Signed)
done

## 2013-06-20 ENCOUNTER — Ambulatory Visit (INDEPENDENT_AMBULATORY_CARE_PROVIDER_SITE_OTHER): Payer: 59 | Admitting: Sports Medicine

## 2013-06-20 ENCOUNTER — Encounter: Payer: Self-pay | Admitting: Sports Medicine

## 2013-06-20 VITALS — BP 117/75 | Ht 63.0 in | Wt 125.0 lb

## 2013-06-20 DIAGNOSIS — M25579 Pain in unspecified ankle and joints of unspecified foot: Secondary | ICD-10-CM

## 2013-06-20 NOTE — Assessment & Plan Note (Signed)
I suspect this is primarily metatarsalgia  Trial at first with MT cookie on RT/ 5th ray post on left  Sports insoles  If this works well we can continue/ if not enough move to custom orthotics

## 2013-06-20 NOTE — Progress Notes (Signed)
   Subjective:    Patient ID: Lala LundLori B Posada, female    DOB: [birth year], 53 y.o.   MRN: [id number]  HPI [name] is a 53 year-old who presents complaining of persistent left 5th metatarsal discomfort after having sustained fracture of same 3 years ago.  Fracture was managed conservatively and pain improved, but she reports continued tenderness of area especially when walking fast or running.  She also endorses right metatarsal pain, point to metatarsal regions corresponding with 2-4 digits.  She states pain is throbbing, does not radiate, is worsened by walking.     Review of Systems As above     Objective:   Physical Exam General: NAD; well-developed; well-nourished HEENT: [place]/AT Respiratory: work of breathing unlabored MSK: Left fifth metatarsal TTP.  Patient has right 2nd-3rd metatarsal TTP with notable fat pad loss in both feet.  Good talar excursion.  Lower extremities neurovascularly intact.  + loss of transverse arch noted bilaterally. Normal gait. Moderate long arch.       Assessment & Plan:  Foot pain: - 2/2 metatarsalagia -Insoles provided for patient's shoes today with lateral post for left foot and metatarsal pad for right foot.

## 2013-08-01 ENCOUNTER — Encounter: Payer: 59 | Admitting: Sports Medicine

## 2013-08-09 ENCOUNTER — Ambulatory Visit: Payer: 59 | Admitting: Sports Medicine

## 2013-08-31 ENCOUNTER — Encounter: Payer: Self-pay | Admitting: Sports Medicine

## 2013-08-31 ENCOUNTER — Ambulatory Visit (INDEPENDENT_AMBULATORY_CARE_PROVIDER_SITE_OTHER): Payer: 59 | Admitting: Sports Medicine

## 2013-08-31 VITALS — BP 110/77 | Ht 63.0 in | Wt 128.0 lb

## 2013-08-31 DIAGNOSIS — M25579 Pain in unspecified ankle and joints of unspecified foot: Secondary | ICD-10-CM

## 2013-08-31 NOTE — Progress Notes (Signed)
Patient ID: [name], female   DOB: July 17, 1960, 53 y.o.   MRN: [id number]  Patient returns because of foot pain bilaterally  She had a fifth metatarsal fracture that remains painful walking even after healing. On last visit we put a fifth ray post only sports insole and that significantly lessen the pain. Left foot.  On the right foot she has pain behind the second metatarsal and over the metatarsal footpads. We tried a medium metatarsal pad but that was uncomfortable. This was shifted to a small metatarsal pad but remained uncomfortable.  She comes back today to see if we can try different up of padding. She feels more foot pain if she is doing a lot more walking or standing. Faster walking also increases the pain.  Examination  No acute distress  On both feet she has a preserved longitudinal arch but does have some mild pronation   She has a loss of the foot pad under the metatarsal heads on both feet There is tenderness just proximal to the second metatarsal head RT No abnormal callusing Transverse arch is somewhat flattened right and left  Walking gait is normal with the exception of some slight pronation Fifth metatarsal on the left is not tender today

## 2013-08-31 NOTE — Assessment & Plan Note (Signed)
We continued using a fifth ray post on the left sports insoles  On the right foot we placed a cushioned metatarsal pad all the way across the metatarsal arch We placed this on the plantar surface of the sports insole She had better comfort with the sports insoles in place and a more normal walking gait  If this works we'll continue with this and if not we will make her custom orthotics

## 2014-02-09 ENCOUNTER — Ambulatory Visit (INDEPENDENT_AMBULATORY_CARE_PROVIDER_SITE_OTHER): Payer: 59 | Admitting: Internal Medicine

## 2014-02-09 ENCOUNTER — Ambulatory Visit (INDEPENDENT_AMBULATORY_CARE_PROVIDER_SITE_OTHER): Payer: 59

## 2014-02-09 VITALS — BP 100/60 | HR 71 | Temp 97.9°F | Resp 16 | Ht 64.0 in | Wt 127.0 lb

## 2014-02-09 DIAGNOSIS — R1012 Left upper quadrant pain: Secondary | ICD-10-CM

## 2014-02-09 NOTE — Progress Notes (Addendum)
Subjective:    Patient ID: Dana [name], female    DOB: [birth year], 53 y.o.   MRN: [id number] This chart was scribed for Robert P. Merla Riches, MD by Chestine Spore, ED Scribe. The patient was seen in room 13 at 6:13 PM.   Chief Complaint  Patient presents with   Abdominal Pain    spreading to chest area    HPI Dana [name] is a 53 y.o. female who presents today complaining of abdominal pain onset this morning. She states that this morning she was fine ans she had some coffee and graham cracker and then her stomach became hard and protruding. She states that she tried to walk it off with no relief for her symptoms. She states that she had dinner around 8 PM. She states that she has not had much to eat today. She states that she is not able to go to the bathroom for BM last 2 d.. She states that she is not feeling much discomfort now but her stomach is still hard. She states that she is having associated symptoms of rapid heart beat. She denies fever, SOB, and any other associated symptoms.  Past history GI Dr. Arlyce Dice stable for last several years.     Patient Active Problem List   Diagnosis Date Noted   Pain in joint, ankle and foot 06/20/2013   Menopause 11/08/2011   Nasal septal deviation 11/08/2011   Insomnia 11/08/2011   AR (allergic rhinitis) 11/08/2011   Past Medical History  Diagnosis Date   Vertigo    History reviewed. No pertinent past surgical history. Allergies  Allergen Reactions   Prednisone     Feels like skin crawls   Prior to Admission medications   Medication Sig Start Date End Date Taking? Authorizing Provider  dicyclomine (BENTYL) 10 MG capsule Take 1 capsule (10 mg total) by mouth 4 (four) times daily -  before meals and at bedtime. 12/22/12   Tonye Pearson, MD  loratadine (CLARITIN) 10 MG tablet Take 10 mg by mouth daily.    Historical Provider, MD  meclizine (ANTIVERT) 50 MG tablet Take 1 tablet (50 mg total) by mouth 3 (three) times  daily as needed. 04/28/12   Hannah Muthersbaugh, PA-C  omeprazole (PRILOSEC) 40 MG capsule Take 1 capsule (40 mg total) by mouth daily. 01/16/13   Tonye Pearson, MD      Review of Systems  Constitutional: Negative for fever and chills.  HENT: Negative for ear pain.   Eyes: Negative for pain.  Respiratory: Negative for cough.   Gastrointestinal: Negative for nausea.  Genitourinary: Negative for frequency.  Musculoskeletal: Negative for back pain and neck pain.  Neurological: Negative for syncope and headaches.       Objective:   Physical Exam  Nursing note and vitals reviewed. Constitutional: She is oriented to person, place, and time. She appears well-developed and well-nourished. No distress.  HENT:  Head: Normocephalic and atraumatic.  Eyes: EOM are normal.  Neck: Neck supple.  Cardiovascular: Normal rate, regular rhythm and normal heart sounds.   Pulmonary/Chest: Effort normal and breath sounds normal. No respiratory distress.  Abdominal: There is tenderness.  The bowel sounds are active Mildly tender epigastric and left lower quadrant without rebound or guarding No masses or organomegaly  Musculoskeletal: Normal range of motion.  Neurological: She is alert and oriented to person, place, and time. No cranial nerve deficit.  Skin: Skin is warm and dry.  Psychiatric: She has a normal mood and affect. Her  behavior is normal.     [facility] reading (PRIMARY) by  Dr. Doolittle=lots of air/stool otherwise ok      BP 100/60   Pulse 71   Temp(Src) 97.9 F (36.6 C) (Oral)   Resp 16   Ht  (1.626 m)   Wt 127 lb (57.607 kg)   BMI 21.79 kg/m2   SpO2 99%   Assessment & Plan:  I personally performed the services described in this documentation, which was scribed in my presence. The recorded information has been reviewed and is accurate.   Pain, abdominal, LUQ - Plan: DG Abd 1 View  Advise meds MiraLax twice a day until regular BMs//fiber and fluid added to diet

## 2014-03-11 ENCOUNTER — Ambulatory Visit (INDEPENDENT_AMBULATORY_CARE_PROVIDER_SITE_OTHER): Payer: 59 | Admitting: Family Medicine

## 2014-03-11 VITALS — BP 110/70 | HR 96 | Temp 98.2°F | Resp 16 | Ht 64.0 in | Wt 123.0 lb

## 2014-03-11 DIAGNOSIS — J029 Acute pharyngitis, unspecified: Secondary | ICD-10-CM

## 2014-03-11 LAB — POCT RAPID STREP A (OFFICE): Rapid Strep A Screen: NEGATIVE

## 2014-03-11 MED ORDER — AMOXICILLIN 500 MG PO CAPS: 500.0000 mg | ORAL_CAPSULE | Freq: Three times a day (TID) | ORAL | Status: DC

## 2014-03-11 NOTE — Progress Notes (Signed)
Subjective:    Patient ID: Dana [name], female    DOB: 1960/08/14, 53 y.o.   MRN: [id number]  This chart was scribed for Ethelda ChickKristi M Smith, MD by Tonye RoyaltyJoshua Chen, ED Scribe. This patient was seen in room 11 and the patient's care was started at 8:22 AM.   03/11/2014  Sore Throat, Headache and Fever   HPI HPI Comments: Dana [name] is a 53 y.o. female who presents to the [facility] complaining of sore throat with onset 3 days ago. She reports associated fever measured at 102, dental pain, headache, decreasee appetite, and intermittent abdominal pain. She also reports minor cough, congestion, and fatigue. She states she has a history of strep throat, but states it feels unlike strep; she states she had mono in high school. She states her son had a fever of 104 and was diagnosed with double ear infection, but states he was not tested for mono. She denies ear pain, rhinorrhea, nausea, vomiting, diarrhea, or SOB. She states she has not taken any medicine at home.  Review of Systems  Constitutional: Positive for fever, appetite change and fatigue.  HENT: Positive for congestion, dental problem and sore throat. Negative for ear pain and rhinorrhea.   Respiratory: Positive for cough. Negative for shortness of breath.   Gastrointestinal: Positive for abdominal pain. Negative for nausea, vomiting and diarrhea.  Neurological: Positive for headaches.    Past Medical History  Diagnosis Date  . Vertigo    History reviewed. No pertinent past surgical history. Allergies  Allergen Reactions  . Prednisone     Feels like skin crawls   Current Outpatient Prescriptions  Medication Sig Dispense Refill  . loratadine (CLARITIN) 10 MG tablet Take 10 mg by mouth daily.      Marland Kitchen. amoxicillin (AMOXIL) 500 MG capsule Take 1 capsule (500 mg total) by mouth 3 (three) times daily.  30 capsule  0   No current facility-administered medications for this visit.       Objective:    BP  110/70  Pulse 96  Temp(Src) 98.2 F (36.8 C) (Oral)  Resp 16  Ht 5\' 4"  (1.626 m)  Wt 123 lb (55.792 kg)  BMI 21.10 kg/m2  SpO2 97% Physical Exam  Nursing note and vitals reviewed. Constitutional: She is oriented to person, place, and time. She appears well-developed and well-nourished.  HENT:  Head: Normocephalic and atraumatic.  Right Ear: External ear normal.  Left Ear: External ear normal.  Diffuse erythema to throat, exudate on left tonsil, TMs normal  Eyes: Conjunctivae are normal.  Neck: Normal range of motion. Neck supple.  Cardiovascular: Normal rate, regular rhythm and normal heart sounds.   No murmur heard. Pulmonary/Chest: Effort normal and breath sounds normal. No respiratory distress. She has no wheezes. She has no rales.  Abdominal: Soft. Bowel sounds are normal. She exhibits no distension. There is no tenderness. There is no rebound and no guarding.  Musculoskeletal: Normal range of motion.  Lymphadenopathy:    She has cervical adenopathy.  Neurological: She is alert and oriented to person, place, and time.  Skin: Skin is warm and dry.  Psychiatric: She has a normal mood and affect.   Results for orders placed in visit on 03/11/14  POCT RAPID STREP A (OFFICE)      Result Value Ref Range   Rapid Strep A Screen Negative  Negative       Assessment & Plan:   1. Sore throat     1.  Sore throat/tonsillitis:  New. Send throat culture. Treat empirically with Amoxicillin.  Recommend Tylenol PRN.  RTC inability to swallow.  RTC 4-5 days if no improvement and will test for Mono.   Meds ordered this encounter  Medications  . amoxicillin (AMOXIL) 500 MG capsule    Sig: Take 1 capsule (500 mg total) by mouth 3 (three) times daily.    Dispense:  30 capsule    Refill:  0    No Follow-up on file.    I personally performed the services described in this documentation, which was scribed in my presence.  The recorded information has been reviewed and is  accurate.  Nilda SimmerKristi Smith, M.D.  [facility]  [place] [address] [place], [place]  [id number] [phone] phone [phone] fax

## 2014-03-11 NOTE — Patient Instructions (Signed)

## 2014-03-14 LAB — CULTURE, GROUP A STREP: ORGANISM ID, BACTERIA: NORMAL

## 2015-01-21 ENCOUNTER — Ambulatory Visit (INDEPENDENT_AMBULATORY_CARE_PROVIDER_SITE_OTHER): Payer: 59 | Admitting: Internal Medicine

## 2015-01-21 VITALS — BP 120/72 | HR 74 | Temp 98.1°F | Resp 16 | Ht 63.25 in | Wt 127.0 lb

## 2015-01-21 DIAGNOSIS — K30 Functional dyspepsia: Secondary | ICD-10-CM

## 2015-01-21 DIAGNOSIS — K319 Disease of stomach and duodenum, unspecified: Secondary | ICD-10-CM

## 2015-01-21 DIAGNOSIS — R1013 Epigastric pain: Principal | ICD-10-CM
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
## 2015-02-05 ENCOUNTER — Ambulatory Visit (INDEPENDENT_AMBULATORY_CARE_PROVIDER_SITE_OTHER): Payer: 59 | Admitting: Internal Medicine

## 2015-02-05 VITALS — BP 112/74 | HR 95 | Temp 98.7°F | Resp 18 | Ht 63.5 in | Wt 126.0 lb

## 2015-02-05 DIAGNOSIS — R42 Dizziness and giddiness: Secondary | ICD-10-CM

## 2015-02-05 DIAGNOSIS — R630 Anorexia: Secondary | ICD-10-CM | POA: Diagnosis not present

## 2015-02-05 DIAGNOSIS — H8110 Benign paroxysmal vertigo, unspecified ear: Secondary | ICD-10-CM | POA: Diagnosis not present

## 2015-02-05 DIAGNOSIS — R1033 Periumbilical pain: Secondary | ICD-10-CM

## 2015-02-05 DIAGNOSIS — Z8639 Personal history of other endocrine, nutritional and metabolic disease: Secondary | ICD-10-CM

## 2015-02-05 DIAGNOSIS — R1013 Epigastric pain: Secondary | ICD-10-CM | POA: Diagnosis not present

## 2015-02-05 LAB — POCT CBC
GRANULOCYTE PERCENT: 66.1 % (ref 37–80)
HEMATOCRIT: 41.9 % (ref 37.7–47.9)
HEMOGLOBIN: 13.5 g/dL (ref 12.2–16.2)
LYMPH, POC: 2 (ref 0.6–3.4)
MCH, POC: 28.5 pg (ref 27–31.2)
MCHC: 32.3 g/dL (ref 31.8–35.4)
MCV: 88.4 fL (ref 80–97)
MID (cbc): 0.1 (ref 0–0.9)
MPV: 6.7 fL (ref 0–99.8)
POC GRANULOCYTE: 4.1 (ref 2–6.9)
POC LYMPH %: 32.7 % (ref 10–50)
POC MID %: 1.2 % (ref 0–12)
Platelet Count, POC: 241 10*3/uL (ref 142–424)
RBC: 4.75 M/uL (ref 4.04–5.48)
RDW, POC: 13.4 %
WBC: 6.2 10*3/uL (ref 4.6–10.2)

## 2015-02-05 LAB — POCT SEDIMENTATION RATE: POCT SED RATE: 14 mm/hr (ref 0–22)

## 2015-02-05 LAB — COMPREHENSIVE METABOLIC PANEL
ALK PHOS: 63 U/L (ref 33–130)
ALT: 14 U/L (ref 6–29)
AST: 23 U/L (ref 10–35)
Albumin: 4.4 g/dL (ref 3.6–5.1)
BUN: 9 mg/dL (ref 7–25)
CALCIUM: 9.6 mg/dL (ref 8.6–10.4)
CO2: 26 mmol/L (ref 20–31)
Chloride: 103 mmol/L (ref 98–110)
Creat: 0.71 mg/dL (ref 0.50–1.05)
Glucose, Bld: 101 mg/dL — ABNORMAL HIGH (ref 65–99)
POTASSIUM: 4.3 mmol/L (ref 3.5–5.3)
Sodium: 139 mmol/L (ref 135–146)
TOTAL PROTEIN: 7.1 g/dL (ref 6.1–8.1)
Total Bilirubin: 0.4 mg/dL (ref 0.2–1.2)

## 2015-02-05 LAB — LIPID PANEL
Cholesterol: 221 mg/dL — ABNORMAL HIGH (ref 125–200)
HDL: 80 mg/dL (ref >=46)
LDL Cholesterol: 126 mg/dL (ref <130)
TRIGLYCERIDES: 77 mg/dL (ref <150)
Total CHOL/HDL Ratio: 2.8 Ratio (ref <=5.0)
VLDL: 15 mg/dL (ref <30)

## 2015-02-05 LAB — TSH: TSH: 2.278 u[IU]/mL (ref 0.350–4.500)

## 2015-02-05 NOTE — Progress Notes (Addendum)
Subjective:  This chart was scribed for Ellamae Sia, MD by Stann Ore, Medical Scribe. This patient was seen in Room 5 and the patient's care was started at 3:13 PM.     Patient ID: Dana [name], female    DOB: 1960/05/28, 54 y.o.   MRN: [id number] Chief Complaint  Patient presents with  . Follow-up  . stomach issues  . Dizziness    saturday     HPI Dana [name] is a 54 y.o. female who presents to [facility] complaining of stomach issues with some dizziness starting 3 days ago.  She was out of town with her family for a few days. She felt dehydrated after running all day 3 days ago. But she continued walking the next day, and had some food after (yogurt, blueberry muffin and granola), then felt cramping pain in her stomach. After eating, she would burp but denies feeling bloated and gassy. She had to lay down nearly all day due to a vertigo episode. She hasn't really been hungry with some appetite loss for the past 2 weeks. She had less BM while she was out of her house. But, since she came back home, she denies any constipation, and any UTI symptoms. She denies headaches and sleep disturbances. She restarted proton pump inhibitors 20 mg Prilosec about 2 weeks to 3 weeks ago. Her symptoms were not classic for GERD but she had responded to this treatment by GI in the past for similar dyspepsia.  Today she feels vaguely lightheaded with a decreased appetite and concern some things wrong which has not been identified.  C extensive past history with GI evaluation among others. Past history of a vertigo spell with collapse.   Patient Active Problem List   Diagnosis Date Noted  . Pain in joint, ankle and foot 06/20/2013  . Menopause 11/08/2011  . Nasal septal deviation 11/08/2011  . Insomnia 11/08/2011  . AR (allergic rhinitis) 11/08/2011    Current outpatient prescriptions:  .  loratadine (CLARITIN) 10 MG tablet, Take 10 mg by mouth daily., Disp: , Rfl:     Review of  Systems  Constitutional: Positive for appetite change. Negative for chills, fatigue and unexpected weight change.  HENT:       She has had mild allergic symptoms and has taken intermittent and histamines  Gastrointestinal: Positive for abdominal pain. Negative for nausea, vomiting, diarrhea and constipation.  Genitourinary: Negative for dysuria, urgency, frequency and decreased urine volume.  Neurological: Positive for dizziness. Negative for syncope and headaches.       Objective:   Physical Exam  Constitutional: She is oriented to person, place, and time. She appears well-developed and well-nourished. No distress.  HENT:  Head: Normocephalic and atraumatic.  Right Ear: External ear normal.  Left Ear: External ear normal.  Mouth/Throat: Oropharynx is clear and moist.  Mild allergic turbs  Eyes: Conjunctivae and EOM are normal. Pupils are equal, round, and reactive to light.  Neck: Neck supple. No thyromegaly present.  Cardiovascular: Normal rate, regular rhythm, normal heart sounds and intact distal pulses.   No murmur heard. Pulmonary/Chest: Effort normal and breath sounds normal. No respiratory distress. She has no wheezes.  Abdominal: Soft. Bowel sounds are normal. She exhibits no distension and no mass. There is no rebound and no guarding.  Mildly tender epigastric  Musculoskeletal: Normal range of motion. She exhibits no edema.  Lymphadenopathy:    She has no cervical adenopathy.  Neurological: She is alert and oriented to person, place, and time.  She has normal reflexes. No cranial nerve deficit. Coordination normal.  Skin: Skin is warm and dry.  Psychiatric: She has a normal mood and affect. Her behavior is normal.  Nursing note and vitals reviewed.   BP 112/74 mmHg  Pulse 95  Temp(Src) 98.7 F (37.1 C) (Oral)  Resp 18  Ht 5' 3.5" (1.613 m)  Wt 126 lb (57.153 kg)  BMI 21.97 kg/m2  SpO2 99%   Wt Readings from Last 3 Encounters:  02/05/15 126 lb (57.153 kg)    01/21/15 127 lb (57.607 kg)  03/11/14 123 lb (55.792 kg)   Results for orders placed or performed in visit on 02/05/15  Comprehensive metabolic panel  Result Value Ref Range   Sodium 139 135 - 146 mmol/L   Potassium 4.3 3.5 - 5.3 mmol/L   Chloride 103 98 - 110 mmol/L   CO2 26 20 - 31 mmol/L   Glucose, Bld 101 (H) 65 - 99 mg/dL   BUN 9 7 - 25 mg/dL   Creat 1.61 0.96 - 0.45 mg/dL   Total Bilirubin 0.4 0.2 - 1.2 mg/dL   Alkaline Phosphatase 63 33 - 130 U/L   AST 23 10 - 35 U/L   ALT 14 6 - 29 U/L   Total Protein 7.1 6.1 - 8.1 g/dL   Albumin 4.4 3.6 - 5.1 g/dL   Calcium 9.6 8.6 - 40.9 mg/dL  TSH  Result Value Ref Range   TSH 2.278 0.350 - 4.500 uIU/mL  Lipid panel  Result Value Ref Range   Cholesterol 221 (H) 125 - 200 mg/dL   Triglycerides 77 <811 mg/dL   HDL 80 >=91 mg/dL   Total CHOL/HDL Ratio 2.8 <=5.0 Ratio   VLDL 15 <30 mg/dL   LDL Cholesterol 478 <295 mg/dL  POCT CBC  Result Value Ref Range   WBC 6.2 4.6 - 10.2 K/uL   Lymph, poc 2.0 0.6 - 3.4   POC LYMPH PERCENT 32.7 10 - 50 %L   MID (cbc) 0.1 0 - 0.9   POC MID % 1.2 0 - 12 %M   POC Granulocyte 4.1 2 - 6.9   Granulocyte percent 66.1 37 - 80 %G   RBC 4.75 4.04 - 5.48 M/uL   Hemoglobin 13.5 12.2 - 16.2 g/dL   HCT, POC 62.1 30.8 - 47.9 %   MCV 88.4 80 - 97 fL   MCH, POC 28.5 27 - 31.2 pg   MCHC 32.3 31.8 - 35.4 g/dL   RDW, POC 65.7 %   Platelet Count, POC 241 142 - 424 K/uL   MPV 6.7 0 - 99.8 fL  POCT SEDIMENTATION RATE  Result Value Ref Range   POCT SED RATE 14 0 - 22 mm/hr        Assessment & Plan:  I have completed the patient encounter in its entirety as documented by the scribe, with editing by me where necessary. Gwynne Kemnitz P. Merla Riches, M.D. Loss of appetite - Plan: Comprehensive metabolic panel, TSH  Periumbilical abdominal pain - ?etio//c/c PPI///pepto prn/adv to full diet  Benign paroxysmal positional vertigo, unspecified laterality -Brandt darhoff -occas meclizine  Dyspepsia - Plan: US  Abdomen Limited RUQ  Hx of familial combined hyperlipidemia - Plan: Lipid panel   Call plan after ultrasound

## 2015-02-05 NOTE — Patient Instructions (Addendum)
Brandt darhoff maneuver meclizine

## 2015-02-12 ENCOUNTER — Ambulatory Visit
Admission: RE | Admit: 2015-02-12 | Discharge: 2015-02-12 | Disposition: A | Discharge status: Home / Self Care | Payer: 59 | Source: Ambulatory Visit | Attending: Internal Medicine | Admitting: Internal Medicine

## 2015-02-12 DIAGNOSIS — R1033 Periumbilical pain: Secondary | ICD-10-CM

## 2015-02-12 DIAGNOSIS — R1013 Epigastric pain: Secondary | ICD-10-CM

## 2015-05-03 ENCOUNTER — Telehealth: Payer: Self-pay | Admitting: Internal Medicine

## 2015-05-03 DIAGNOSIS — R1011 Right upper quadrant pain: Secondary | ICD-10-CM

## 2015-05-03 DIAGNOSIS — R1013 Epigastric pain: Secondary | ICD-10-CM

## 2015-05-03 NOTE — Telephone Encounter (Signed)
Phone --another few weeks of episodic GI trouble with pp dyspep, loss of appetite, ruq discomfort, even one episode nocturnal pain and burning after tic tac hs --does not want another trial ppi--has that in 2006-7 after Bswall showed some reflux--Dr Arlyce DiceKaplan  Plan -hida scan If negative will arrange gi eval to consider endoscopy

## 2015-05-07 NOTE — Telephone Encounter (Signed)
Appointment made for 12/16 # 11:30 am with Dr. Merla Richesoolittle.

## 2015-05-10 ENCOUNTER — Ambulatory Visit (INDEPENDENT_AMBULATORY_CARE_PROVIDER_SITE_OTHER): Payer: 59 | Admitting: Internal Medicine

## 2015-05-10 ENCOUNTER — Encounter: Payer: Self-pay | Admitting: Internal Medicine

## 2015-05-10 VITALS — BP 138/75 | HR 67 | Temp 98.2°F | Resp 16 | Ht 63.5 in | Wt 127.0 lb

## 2015-05-10 DIAGNOSIS — G478 Other sleep disorders: Secondary | ICD-10-CM

## 2015-05-10 DIAGNOSIS — G479 Sleep disorder, unspecified: Secondary | ICD-10-CM | POA: Diagnosis not present

## 2015-05-10 DIAGNOSIS — J3489 Other specified disorders of nose and nasal sinuses: Secondary | ICD-10-CM | POA: Diagnosis not present

## 2015-05-10 DIAGNOSIS — H8110 Benign paroxysmal vertigo, unspecified ear: Secondary | ICD-10-CM

## 2015-05-10 MED ORDER — ZALEPLON 10 MG PO CAPS: ORAL_CAPSULE | ORAL | Status: DC

## 2015-05-10 NOTE — Progress Notes (Signed)
   Subjective:    Patient ID: Dana [name], female    DOB: 1961/01/01, [age] y.o.   MRN: [id number]  HPIreturns with recurrence of abd problems and sleep issues See 9/16 She is now waking about every 2 hours at night and having difficulty going back to sleep. Some nights she just has to get up. This leaves her irritable with daytime somnolence Unsure why she wakes, but she does notice breathing issues at night. She can't breathe thru Her right nostril and has known septal deviation which was recommended for repair -she elected not to. She has had some trouble with the other nostril  recently. There are times when she wakes needing to get a breath. Her father has sleep apnea. Her husband has never noticed apnea or mentioned snoring for her.  She has also had loss of appetite with bloating but no recent weight loss. She has constipation and diarrhea. This has occurred in the past and we have done gallbladder ultrasound =wnl along with normalmetabolic profile She has been evaluated by Dr. Evette CristalGanem  She has concerns about a skin lesion right lower quadrant present for several months but without chang.  She was followed by Dr. Sandria ManlyLove for vertigo for several years and would like referral to a new neurologist  Now that he has retired.  She is not currently symptomatic.  Review of Systems     Objective:   Physical Exam Wt Readings from Last 3 Encounters:  05/10/15 127 lb (57.607 kg)  02/05/15 126 lb (57.153 kg)  01/21/15 127 lb (57.607 kg)  HEENT clear, although little airspace right nostril Heart regular Abdomen soft nontender nondistended No organomegaly or masses There is a small 0.5 cm soft movable mass in the right lower quadrant skin consistent with lipoma xtremities clear Neurological intact Mood good and affect appropriate     Assessment & Plan:  Nasal obstruction - Plan: Ambulatory referral to ENT  Sleep dysfunction with arousal disturbance - if ENT doesn't solve this issue will  consider sleep study  Benign paroxysmal positional vertigo, unspecified laterality - Plan: Ambulatory referral to Neurology  Meds ordered this encounter  Medications  . zaleplon (SONATA) 10 MG capsule    Sig: One tab if wakes early    Dispense:  15 capsule    Refill:  0

## 2015-05-16 ENCOUNTER — Encounter (HOSPITAL_COMMUNITY): Payer: 59

## 2015-05-25 ENCOUNTER — Ambulatory Visit (INDEPENDENT_AMBULATORY_CARE_PROVIDER_SITE_OTHER): Payer: 59 | Admitting: Internal Medicine

## 2015-05-25 VITALS — BP 160/90 | HR 109 | Temp 98.1°F | Resp 20 | Ht 63.0 in | Wt 127.4 lb

## 2015-05-25 DIAGNOSIS — R209 Unspecified disturbances of skin sensation: Secondary | ICD-10-CM | POA: Diagnosis not present

## 2015-05-25 DIAGNOSIS — R202 Paresthesia of skin: Secondary | ICD-10-CM

## 2015-05-25 MED ORDER — CYCLOBENZAPRINE HCL 10 MG PO TABS: 10.0000 mg | ORAL_TABLET | Freq: Every day | ORAL | Status: DC

## 2015-05-25 NOTE — Progress Notes (Signed)
Subjective:    Patient ID: Dana [name], female    DOB: Jan 24, 1961, 54 y.o.   MRN: [id number] By signing my name below, I, Javier Docker, attest that this documentation has been prepared under the direction and in the presence of Ellamae Sia, MD. Electronically Signed: Javier Docker, ER Scribe. 05/25/2015. 1:16 PM.  Chief Complaint  Patient presents with  . Other    Strange sensation on left side face    HPI HPI Comments: Dana [name] is a 54 y.o. female who presents to [facility] complaining of tingling and pain in her bilateral arms for the last week, as well as facial tingling that started yesterday. Her sx started with severe pain in her left arm in the middle of the night, which then progressed to numbness and then to her bilateral arms. This started after she lifted some heavy weights and her back became very tight. Has resolved with PT_OHalloran Two days ago she then developed tingling in her L face. Her vision is unaffected. She felt nauseous yesterday. She denies trouble swallowing, or any trouble forming words. She has not had much of an appetite recently. She has had some racing heart rate intermittently, which was worse after she got an adjustment at the chiropractor yesterday. No HA.  Patient Active Problem List   Diagnosis Date Noted  . Pain in joint, ankle and foot 06/20/2013  . Menopause 11/08/2011  . Nasal septal deviation 11/08/2011  . Insomnia 11/08/2011  . AR (allergic rhinitis) 11/08/2011   Recent GI sxt resolved w/ miralax Past Medical History  Diagnosis Date  . Vertigo    Allergies  Allergen Reactions  . Prednisone     Feels like skin crawls    Review of Systems  Constitutional: Negative for fever, chills and unexpected weight change.  HENT: Negative for trouble swallowing.   Eyes: Negative for visual disturbance.  Respiratory: Negative for shortness of breath.   Gastrointestinal: Positive for nausea. Negative for vomiting.    Genitourinary: Negative for difficulty urinating.  Musculoskeletal: Negative for neck pain.  Neurological: Positive for numbness. Negative for facial asymmetry and weakness.  Psychiatric/Behavioral: Negative for dysphoric mood. The patient is not nervous/anxious.       Objective:  BP 160/90 mmHg  Pulse 109  Temp(Src) 98.1 F (36.7 C) (Oral)  Resp 20  Ht  (1.6 m)  Wt 127 lb 6.4 oz (57.788 kg)  BMI 22.57 kg/m2  SpO2 99%  Physical Exam  Constitutional: She appears well-developed and well-nourished. No distress.  HENT:  Head: Normocephalic and atraumatic.  Eyes: Pupils are equal, round, and reactive to light.  Neck: Neck supple.  Cardiovascular: Normal rate.   Pulmonary/Chest: Effort normal. No respiratory distress.  Musculoskeletal: Normal range of motion.  Tight trapezii/paraspin muscles  Neurological:  Neurological Exam: MENTAL STATUS including orientation to time, place, person, recent and remote memory, attention span and concentration, language, and fund of knowledge is normal. Speech is not dysarthric.  CRANIAL NERVES: II: No visual field defects.  III-IV-VI: Pupils equal round and reactive to light. Normal conjugate, extra-ocular eye movements in all directions of gaze. No nystagmus. No ptosis.  V:Intact facial sensation tho c/o less feeling to light touch on L malar and forehead  VII: Normal facial symmetry and movements. Facial muscles including orbicularis oris, orbicularis oculi, and buccinator muscles are 5/5. VIII: Normal hearing and vestibular function.  IX-X: Normal palatal movement.  XI: Normal shoulder shrug and head rotation.  XII: Normal tongue strength and range  of motion, no deviation or fasciculation.   MOTOR: No atrophy, fasciculations or abnormal movements. No pronator drift. Tone is normal. No tenderness to muscle palpation.  Neck flexion and extension is 5/5 DTRs 2-3 + and symmetrical  SENSORY:intact  extremities  Romberg's sign absent.   COORDINATION/GAIT: Normal finger-to- nose-finger and heel-to-shin. Intact rapid alternating movements bilaterally. Able to rise from a chair without using arms. Gait narrow based and stable   Skin: Skin is warm and dry. She is not diaphoretic.  Psychiatric: She has a normal mood and affect. Her behavior is normal.  Nursing note and vitals reviewed.     Assessment & Plan:  Facial paresthesia reassured no evidence CVA Will follow for progression of sxt to see if MRI needed to evaluate L trigeminal tract  No meds for now except Flex hs Meds ordered this encounter  Medications  . cyclobenzaprine (FLEXERIL) 10 MG tablet    Sig: Take 1 tablet (10 mg total) by mouth at bedtime.    Dispense:  15 tablet    Refill:  0     I have completed the patient encounter in its entirety as documented by the scribe, with editing by me where necessary. Orlanda Frankum P. Merla Richesoolittle, M.D.

## 2015-06-04 ENCOUNTER — Other Ambulatory Visit: Payer: Self-pay | Admitting: Internal Medicine

## 2015-06-04 DIAGNOSIS — R1011 Right upper quadrant pain: Secondary | ICD-10-CM

## 2015-06-04 DIAGNOSIS — R630 Anorexia: Secondary | ICD-10-CM

## 2015-06-04 DIAGNOSIS — R1013 Epigastric pain: Secondary | ICD-10-CM

## 2015-06-04 NOTE — Progress Notes (Unsigned)
Dyspepsia - Plan: NM Hepato W/Eject Fract  RUQ abdominal pain - Plan: NM Hepato W/Eject Fract  Loss of appetite - Plan: NM Hepato W/Eject Fract  Go ahead with HIDA at her request---also set up GI f/u

## 2015-06-13 ENCOUNTER — Ambulatory Visit: Payer: 59 | Admitting: Neurology

## 2015-07-03 ENCOUNTER — Ambulatory Visit: Payer: 59 | Admitting: Neurology

## 2015-07-16 ENCOUNTER — Encounter: Payer: 59 | Admitting: Sports Medicine

## 2015-07-18 ENCOUNTER — Encounter: Payer: Self-pay | Admitting: Sports Medicine

## 2015-07-18 ENCOUNTER — Ambulatory Visit (INDEPENDENT_AMBULATORY_CARE_PROVIDER_SITE_OTHER): Payer: 59 | Admitting: Sports Medicine

## 2015-07-18 VITALS — BP 121/71 | HR 78 | Ht 63.0 in | Wt 128.0 lb

## 2015-07-18 DIAGNOSIS — R269 Unspecified abnormalities of gait and mobility: Secondary | ICD-10-CM | POA: Diagnosis not present

## 2015-07-18 HISTORY — DX: Unspecified abnormalities of gait and mobility: R26.9

## 2015-07-18 NOTE — Assessment & Plan Note (Addendum)
Her foot structure is pretty good overall, but does have collapse transverse arch B/L and calcaneal valgus on L.  Would recommend we have a trial of green inserts with MT pads and if works over next four weeks, would do custom orthotics to help prevent further collapse of her transverse arch.

## 2015-07-18 NOTE — Progress Notes (Signed)
  Dana [name] - 55 y.o. female MRN [id number]  Date of birth: [birth year] Dana [name] is a 55 y.o. female who presents today for Foot pain.  Foot pain, initial visit 07/18/15 - Ongoing now off and on for several years.  Previous MT fx of the L 5th MT two years ago 2/2 inversion ankle sprain.  Placed in CAM walker for 8 weeks with 3 weeks of NWB and occasionally has pain from this.  Pain mostly in the medial aspect of her arch on the L side with occasional pain in the lateral column.  She has never had orthotics and has tried green inserts once before with maybe some success.  Denies new acute injury or paresthesias going distally into her foot.   PMHx - Updated and reviewed.  Contributory factors include: 5th MT fx on L  PSHx - Updated and reviewed.  Contributory factors include:  Negative  FHx - Updated and reviewed.  Contributory factors include:  HTN  Social Hx - Updated and reviewed. Contributory factors include: Non smoker   Medications - Claritin    ROS Per HPI.  12 point negative other than per HPI.   Exam:  Filed Vitals:   07/18/15 1101  BP: 121/71  Pulse: 78   Gen: NAD, AAO 3 Cardio- RRR Pulm - Normal respiratory effort/rate Skin: No rashes or erythema Extremities: No edema  Vascular: pulses +2 bilateral upper and lower extremity Psych: Normal affect  MSK: Feet - Dynamic pes planus L with calcaneal valgus on L.  B/L collapse of transverse arch.  Lateral column L foot with no obvious hammer toeing or bunionette.  Neurovascular intact

## 2015-07-31 ENCOUNTER — Ambulatory Visit (INDEPENDENT_AMBULATORY_CARE_PROVIDER_SITE_OTHER): Payer: 59 | Admitting: Neurology

## 2015-07-31 ENCOUNTER — Encounter: Payer: Self-pay | Admitting: Neurology

## 2015-07-31 VITALS — BP 134/76 | HR 97 | Ht 63.5 in | Wt 130.6 lb

## 2015-07-31 DIAGNOSIS — G43109 Migraine with aura, not intractable, without status migrainosus: Secondary | ICD-10-CM | POA: Insufficient documentation

## 2015-07-31 DIAGNOSIS — H8110 Benign paroxysmal vertigo, unspecified ear: Secondary | ICD-10-CM | POA: Insufficient documentation

## 2015-07-31 DIAGNOSIS — G43009 Migraine without aura, not intractable, without status migrainosus: Secondary | ICD-10-CM | POA: Insufficient documentation

## 2015-07-31 HISTORY — DX: Benign paroxysmal vertigo, unspecified ear: H81.10

## 2015-07-31 HISTORY — DX: Migraine with aura, not intractable, without status migrainosus: G43.109

## 2015-07-31 NOTE — Progress Notes (Signed)
NEUROLOGY CONSULTATION NOTE  Dana LundLori B [name] MRN: [id number] DOB: 02/25/1961  Referring provider: Dr. Ellamae Siaobert Doolittle (transfer of care as he is retiring) Primary care provider: Dr. Merla Richesoolittle (but he is retiring)  Reason for consult:  Vertigo, migraine  HISTORY OF PRESENT ILLNESS: Dana [name] is a 55 year old left-handed female who presents for vertigo and migraine.  History obtained by patient, prior neurologist's notes and PCP notes.  Imaging of brain MRI/MRA reviewed.  In 2006, she had a viral illness, in which she had syncope and developed diffuse weakness and paresthesias.  Neurologic workup and physical exam was normal.  She has had approximately 3 episodes of vertigo, described as spinning.  In 2012, she woke up one morning with spinning sensation.  It would last a few seconds with head movement.  She also had nasal congestion.  She was treated with antibiotics, which were ineffective.  CT of her sinuses and MRI of brain with and without contrast from April 2012 were unremarkable.  She had a positive Dix-Hallpike and was diagnosed with BPPV.  She had another episode about a year later, which was severe and lasted a few seconds but afterwards she was nauseous and vomited.  MRI and MRA of head from 04/28/12 was unremarkable.  She had one other episode a few months ago, which quickly resolved.  She has been evaluated by ENT and has deviated septum to the right.  She also has migraines.  They usually occur at the back of her head.  Typically they are mild but can be pounding.  When severe, they are accompanied by nausea and photophobia.  They are preceded by visual aura of zigzag lines.  They occur for the day but she does not take any pain reliever for it.  They occur once in a while, such as every couple of months.  She   PAST MEDICAL HISTORY: Past Medical History  Diagnosis Date  . Vertigo   . Headache     PAST SURGICAL HISTORY: History reviewed. No pertinent past surgical  history.  MEDICATIONS: Current Outpatient Prescriptions on File Prior to Visit  Medication Sig Dispense Refill  . loratadine (CLARITIN) 10 MG tablet Take 10 mg by mouth daily. Reported on 05/25/2015     No current facility-administered medications on file prior to visit.    ALLERGIES: Allergies  Allergen Reactions  . Prednisone     Feels like skin crawls    FAMILY HISTORY: Family History  Problem Relation Age of Onset  . Hypertension Mother   . Hypertension Father   . Cancer Father   . Heart disease Father   . Diabetes Father   . Parkinsonism Paternal Grandfather     SOCIAL HISTORY: Social History   Social History  . Marital Status: Married    Spouse Name: N/A  . Number of Children: N/A  . Years of Education: N/A   Occupational History  . Not on file.   Social History Main Topics  . Smoking status: Never Smoker   . Smokeless tobacco: Not on file  . Alcohol Use: Not on file  . Drug Use: Not on file  . Sexual Activity: Not on file   Other Topics Concern  . Not on file   Social History Narrative    REVIEW OF SYSTEMS: Constitutional: No fevers, chills, or sweats, no generalized fatigue, change in appetite Eyes: No visual changes, double vision, eye pain Ear, nose and throat: No hearing loss, ear pain, nasal congestion, sore throat Cardiovascular: No chest  pain, palpitations Respiratory:  No shortness of breath at rest or with exertion, wheezes GastrointestinaI: No nausea, vomiting, diarrhea, abdominal pain, fecal incontinence Genitourinary:  No dysuria, urinary retention or frequency Musculoskeletal:  No neck pain, back pain Integumentary: No rash, pruritus, skin lesions Neurological: as above Psychiatric: No depression, insomnia, anxiety Endocrine: No palpitations, fatigue, diaphoresis, mood swings, change in appetite, change in weight, increased thirst Hematologic/Lymphatic:  No anemia, purpura, petechiae. Allergic/Immunologic: no itchy/runny eyes,  nasal congestion, recent allergic reactions, rashes  PHYSICAL EXAM: Filed Vitals:   07/31/15 1047  BP: 134/76  Pulse: 97   General: No acute distress.  Patient appears well-groomed.  Head:  Normocephalic/atraumatic Eyes:  fundi unremarkable, without vessel changes, exudates, hemorrhages or papilledema. Neck: supple, no paraspinal tenderness, full range of motion Back: No paraspinal tenderness Heart: regular rate and rhythm Lungs: Clear to auscultation bilaterally. Vascular: No carotid bruits. Neurological Exam: Mental status: alert and oriented to person, place, and time, recent and remote memory intact, fund of knowledge intact, attention and concentration intact, speech fluent and not dysarthric, language intact. Cranial nerves: CN I: not tested CN II: pupils equal, round and reactive to light, visual fields intact, fundi unremarkable, without vessel changes, exudates, hemorrhages or papilledema. CN III, IV, VI:  full range of motion, no nystagmus, no ptosis CN V: facial sensation intact CN VII: upper and lower face symmetric CN VIII: hearing intact CN IX, X: gag intact, uvula midline CN XI: sternocleidomastoid and trapezius muscles intact CN XII: tongue midline Bulk & Tone: normal, no fasciculations. Motor:  5/5 throughout  Sensation:  Pinprick and vibration sensation intact. Deep Tendon Reflexes:  2+ throughout, toes downgoing.  Finger to nose testing:  Without dysmetria.  Heel to shin:  Without dysmetria.  Gait:  Normal station and stride.  Able to turn and tandem walk. Romberg negative.  IMPRESSION: Benign paroxysmal positional vertigo, stable Episodic migraine with aura, stable  PLAN: 1.  If vertigo recurs and doesn't resolve on own, consider vestibular rehab 2.  For migraine, advised to try OTC (Excedrin Migraine, ibuprofen, naproxen ) 3.  F/u 1 year or as needed.  Thank you for allowing me to take part in the care of this patient.  Shon Millet, DO   CC:   Ellamae Sia, MD

## 2015-07-31 NOTE — Patient Instructions (Signed)
If you get another episode of vertigo that does not resolve on its own, call us and we can refer you to vestibular rehab For migraine, try Excedrin Migraine, ibuprofen, or  Naproxen 500mg .  If ineffective, call us Follow up in one year or as needed.

## 2015-08-15 ENCOUNTER — Encounter: Payer: Self-pay | Admitting: Sports Medicine

## 2015-08-15 ENCOUNTER — Ambulatory Visit (INDEPENDENT_AMBULATORY_CARE_PROVIDER_SITE_OTHER): Payer: 59 | Admitting: Sports Medicine

## 2015-08-15 VITALS — BP 123/89 | Ht 63.0 in | Wt 128.0 lb

## 2015-08-15 DIAGNOSIS — R269 Unspecified abnormalities of gait and mobility: Secondary | ICD-10-CM | POA: Diagnosis not present

## 2015-08-15 NOTE — Assessment & Plan Note (Addendum)
Patient with SI joint dysfunction of the left as well as gluteus medius weakness bilateral. She also is having low back pain which may be secondary to underlying previous foot fracture on the left. -We will try the green inserts with a 5/16th heel lift to help try to offload her SI joint and low back pain. If this works we will follow up in 6 weeks and consider custom orthotics to help with her back and knee pain. I would add a heel lift with these as well. - As well, continue PT as well as will send to pilates and HEP to help with lumbar extension and hip abduction/core strengthening.

## 2015-08-15 NOTE — Progress Notes (Signed)
  Dana LundLori B [name] - 55 y.o. female MRN [id number]  Date of birth: 1961/04/20 Dana [name] is a 55 y.o. female who presents today for foot pain and back pain.   Foot pain/Back pain - patient previously seen 6 weeks ago and fitted with green inserts with metatarsal pad with possible orthotics today. However she was unable to wear these due to pain in her forefoot. She is now complaining of low back pain. This is localized to the left SI joint and is worse with specific movements. She denies any paresthesias going down either leg. She does work with physical therapy and has noticed improvement but continues to have core strength deficits.  PMHx - Updated and reviewed.  Contributory factors include: Vertigo PSHx - Updated and reviewed.  Contributory factors include:  Negative FHx - Updated and reviewed.  Contributory factors include:  Hypertension father Social Hx - Updated and reviewed. Contributory factors include: Nonsmoker  Medications - updated and reviewed   ROS Per HPI.  12 point negative other than per HPI.   Exam:  Filed Vitals:   08/15/15 0901  BP: 123/89   Gen: NAD, AAO 3 Cardio- RRR Pulm - Normal respiratory effort/rate Skin: No rashes or erythema Extremities: No edema  Vascular: pulses +2 bilateral upper and lower extremity Psych: Normal affect  MSK: Feet: Collapse of transverse arch L>R.  Slight collapse of medial longitudinal arch B/L.  No morton's callous Back:  No hypertonicity of paraspinal muscles.  No TTP along lumbar spine.  + FABER to L SI joint and posterior shear test to L SI joint.  Negative SLR B/L.  4/5 strength hip abduction B/L.  5/5 muscle strength L2-S1 myotome and normal dermatome sensation L2-S1.  DTR 2/4 B/L LE.

## 2015-08-28 ENCOUNTER — Ambulatory Visit (INDEPENDENT_AMBULATORY_CARE_PROVIDER_SITE_OTHER): Payer: 59 | Admitting: Internal Medicine

## 2015-08-28 ENCOUNTER — Encounter: Payer: Self-pay | Admitting: Internal Medicine

## 2015-08-28 VITALS — BP 123/83 | HR 67 | Temp 98.3°F | Resp 16 | Ht 63.5 in | Wt 129.0 lb

## 2015-08-28 DIAGNOSIS — R1011 Right upper quadrant pain: Secondary | ICD-10-CM

## 2015-08-28 DIAGNOSIS — R1013 Epigastric pain: Secondary | ICD-10-CM | POA: Diagnosis not present

## 2015-08-28 DIAGNOSIS — R1031 Right lower quadrant pain: Secondary | ICD-10-CM

## 2015-08-28 DIAGNOSIS — R1010 Upper abdominal pain, unspecified: Secondary | ICD-10-CM

## 2015-08-28 DIAGNOSIS — G8929 Other chronic pain: Secondary | ICD-10-CM | POA: Diagnosis not present

## 2015-08-28 DIAGNOSIS — R8299 Other abnormal findings in urine: Secondary | ICD-10-CM | POA: Diagnosis not present

## 2015-08-28 DIAGNOSIS — R82998 Other abnormal findings in urine: Secondary | ICD-10-CM

## 2015-08-28 DIAGNOSIS — G4700 Insomnia, unspecified: Secondary | ICD-10-CM

## 2015-08-28 DIAGNOSIS — Z23 Encounter for immunization: Secondary | ICD-10-CM

## 2015-08-28 LAB — POC MICROSCOPIC URINALYSIS (UMFC): MUCUS RE: ABSENT

## 2015-08-28 LAB — COMPREHENSIVE METABOLIC PANEL
ALK PHOS: 56 U/L (ref 33–130)
ALT: 15 U/L (ref 6–29)
AST: 21 U/L (ref 10–35)
Albumin: 4.6 g/dL (ref 3.6–5.1)
BUN: 9 mg/dL (ref 7–25)
CALCIUM: 9.8 mg/dL (ref 8.6–10.4)
CHLORIDE: 102 mmol/L (ref 98–110)
CO2: 24 mmol/L (ref 20–31)
Creat: 0.84 mg/dL (ref 0.50–1.05)
GLUCOSE: 96 mg/dL (ref 65–99)
POTASSIUM: 4.1 mmol/L (ref 3.5–5.3)
Sodium: 135 mmol/L (ref 135–146)
Total Bilirubin: 0.5 mg/dL (ref 0.2–1.2)
Total Protein: 7.2 g/dL (ref 6.1–8.1)

## 2015-08-28 LAB — POCT URINALYSIS DIP (MANUAL ENTRY)
BILIRUBIN UA: NEGATIVE
BILIRUBIN UA: NEGATIVE
GLUCOSE UA: NEGATIVE
Leukocytes, UA: NEGATIVE
Nitrite, UA: NEGATIVE
Protein Ur, POC: NEGATIVE
UROBILINOGEN UA: 0.2
pH, UA: 5.5

## 2015-08-28 LAB — CBC WITH DIFFERENTIAL/PLATELET
BASOS PCT: 1 %
Basophils Absolute: 46 cells/uL (ref 0–200)
EOS ABS: 46 {cells}/uL (ref 15–500)
Eosinophils Relative: 1 %
HEMATOCRIT: 40.3 % (ref 35.0–45.0)
Hemoglobin: 14.2 g/dL (ref 11.7–15.5)
LYMPHS ABS: 1564 {cells}/uL (ref 850–3900)
LYMPHS PCT: 34 %
MCH: 30.5 pg (ref 27.0–33.0)
MCHC: 35.2 g/dL (ref 32.0–36.0)
MCV: 86.5 fL (ref 80.0–100.0)
MONOS PCT: 8 %
MPV: 9.4 fL (ref 7.5–12.5)
Monocytes Absolute: 368 cells/uL (ref 200–950)
NEUTROS ABS: 2576 {cells}/uL (ref 1500–7800)
NEUTROS PCT: 56 %
PLATELETS: 191 10*3/uL (ref 140–400)
RBC: 4.66 MIL/uL (ref 3.80–5.10)
RDW: 13.7 % (ref 11.0–15.0)
WBC: 4.6 10*3/uL (ref 3.8–10.8)

## 2015-08-28 LAB — POCT SEDIMENTATION RATE: POCT SED RATE: 20 mm/hr (ref 0–22)

## 2015-08-28 LAB — HEPATITIS C ANTIBODY: HCV Ab: NEGATIVE

## 2015-08-28 NOTE — Progress Notes (Addendum)
   Subjective:    Patient ID: Dana LundLori B Mathia, female    DOB: 02-03-1961, 55 y.o.   MRN: [id number]  HPI follow-up to discuss chronic recurrent abdominal pain with loss of appetite. Her evaluation for this started in September 2016 Her problems have been intermittent ever since. She has had a normal abdominal ultrasound and normal metabolic profile. She continues to complain of no appetite. She is just not hungry. She cooks for her husband and does not 1. At one point she had been dyspepsia postprandial with some discomfort but this has resolved without appetite improvement. No complaints of of reflux point. No diarrhea or constipation. She will have pain for 3 or 4 days at a row that his epigastric, right upper quadrant, and right lower quadrant, and is dull and constant often requiring her to lie down. She may review well the next day after doing this. She is not cramping, has no free flow, is not gassy. She has occasional nausea but this is unpredictable. She has not tried any medications. Last colonoscopy with endoscopy 4 years ago.  Recently she has notices strong urine smell with some darkness but no dysuria frequency or nocturia. She has had some feelings of tenderness deep in her lumbosacral muscles that she pushes but no dysfunctional pain with activity.  Review of Systems There is a long history of insomnia. She doesn't sleep well. Often trouble falling asleep or trouble waking up in the navel unable to go back to sleep. This has been present for many years. She does not take naps. No symptoms of sleep apnea. Does not spirits mind racing or other anxiety events-admits to being a chronic worrier. Serves as go to person for her entire family for all their problems    No weight loss, fever, chills, night sweats. No palpitations See recent neurology evaluation reassuring for symptom vertigo and headache. See recent ENT evaluation for nasal obstruction.  Objective:   Physical Exam BP 123/83  mmHg  Pulse 67  Temp(Src) 98.3 F (36.8 C)  Resp 16  Ht 5' 3.5" (1.613 m)  Wt 129 lb (58.514 kg)  BMI 22.49 kg/m2 Very very small right PC node No ac nodes No thyromegaly or nodules Remainder of HEENT clear Heart regular without murmur Abdomen benign Extremities clear Neuro intact Mood and affect appropriate/thought content normal      Assessment & Plan:  Dark urine - Plan: POCT urinalysis dipstick, POCT Microscopic Urinalysis ([facility])  Abdominal pain, chronic, right lower quadrant - Plan: CBC with Differential/Platelet, Comprehensive metabolic panel, POCT SEDIMENTATION RATE, Hepatitis C antibody  Abdominal pain, chronic, right upper quadrant - Plan: Hepatitis C antibody  Abdominal pain, chronic, epigastric  Insomnia  She will proceed with gastroenterology evaluation at the end of this month, 09/16/2015 We discussed possibilities within the anxiety axis that might be related to her symptoms We discussed the importance sleep and she may eventually need to try medications She will have GYN evaluation Dr. Melynda Kellerr [id number] Labs will be repeated to rule out organic etiology

## 2015-08-28 NOTE — Patient Instructions (Signed)
     IF you received an x-ray today, you will receive an invoice from [facility]. Please contact  Radiology at [phone] with questions or concerns regarding your invoice.   IF you received labwork today, you will receive an invoice from [facility]. Please contact [facility] at [phone] with questions or concerns regarding your invoice.   Our billing staff will not be able to assist you with questions regarding bills from these companies.  You will be contacted with the lab results as soon as they are available. The fastest way to get your results is to activate your My Chart account. Instructions are located on the last page of this paperwork. If you have not heard from us regarding the results in 2 weeks, please contact this office.      

## 2015-09-17 ENCOUNTER — Ambulatory Visit: Payer: 59 | Admitting: Sports Medicine

## 2015-09-17 ENCOUNTER — Other Ambulatory Visit (HOSPITAL_COMMUNITY): Payer: Self-pay | Admitting: Gastroenterology

## 2015-09-17 DIAGNOSIS — R1084 Generalized abdominal pain: Secondary | ICD-10-CM

## 2015-09-25 ENCOUNTER — Encounter (HOSPITAL_COMMUNITY)
Admission: RE | Admit: 2015-09-25 | Discharge: 2015-09-25 | Disposition: A | Discharge status: Home / Self Care | Payer: 59 | Source: Ambulatory Visit | Attending: Gastroenterology | Admitting: Gastroenterology

## 2015-09-25 DIAGNOSIS — R1084 Generalized abdominal pain: Secondary | ICD-10-CM | POA: Diagnosis not present

## 2015-09-25 MED ORDER — TECHNETIUM TC 99M MEBROFENIN IV KIT: 5.1000 | PACK | Freq: Once | INTRAVENOUS | Status: DC | PRN

## 2015-10-22 ENCOUNTER — Ambulatory Visit: Payer: 59 | Admitting: Sports Medicine

## 2016-06-02 ENCOUNTER — Ambulatory Visit: Payer: Self-pay

## 2016-06-02 ENCOUNTER — Encounter: Payer: Self-pay | Admitting: Sports Medicine

## 2016-06-02 ENCOUNTER — Ambulatory Visit (INDEPENDENT_AMBULATORY_CARE_PROVIDER_SITE_OTHER): Payer: 59 | Admitting: Sports Medicine

## 2016-06-02 VITALS — BP 110/70 | Ht 63.0 in | Wt 130.0 lb

## 2016-06-02 DIAGNOSIS — M79672 Pain in left foot: Secondary | ICD-10-CM

## 2016-06-02 NOTE — Progress Notes (Signed)
CC: Left foot pain  Patient jumped up on wall Left foot pain over the heel Able to walk but painful After couple weeks pain worse Went to [facility] urgent ortho care XR showed a spur Put in a boot with concern of PF rupture  Feels better after 1 wk in boot Difficult to sleep in boot  ROS No swelling No pain on plantar surface of heel No night pain  PEXAM NAD BP 110/70   Ht 5\' 3"  (1.6 m)   Wt 130 lb (59 kg)   BMI 23.03 kg/m   Left heel shows no swelling No TTP along PF Minimal TTP over PF insertion medially Great toe movement pain free Pain on palpation of medial calcaneus Hurts to walk or bear weigth  Ultrasound of Left Heel Plantar fascia width is normal at 0.49 Membrane is intact Small spur with soight hypoechoic change Calcaneus is intact with no cortical disruption Soft tissue of heel capsule is hypoechoic on medial side  Impression: soft tissue contusion of left calcaneus;  Small plantar fascia spur but no active plantar fasciitis  Ultrasound and interpretation by Sibyl ParrKarl B. Darrick PennaFields, MD

## 2016-06-02 NOTE — Assessment & Plan Note (Signed)
Try a soft heel pad Use boot if too painful but OK to use shoe when able to walk without a limp Icing This should resolve in 3 to 4 weeks  Appears to be calcaneal contusion and no fracture

## 2016-07-02 ENCOUNTER — Encounter: Payer: Self-pay | Admitting: Sports Medicine

## 2016-07-02 ENCOUNTER — Ambulatory Visit (INDEPENDENT_AMBULATORY_CARE_PROVIDER_SITE_OTHER): Payer: 59 | Admitting: Sports Medicine

## 2016-07-02 VITALS — BP 135/75 | HR 73 | Ht 63.0 in | Wt 131.0 lb

## 2016-07-02 DIAGNOSIS — M79672 Pain in left foot: Secondary | ICD-10-CM

## 2016-07-02 NOTE — Assessment & Plan Note (Signed)
Contusion to calcaneous with ongoing swelling along posterior tibialis tendon.  Gave scaphoid pads and sports insole, size 9-10 to help reduce load on posterior tibialis.  Follow up if not improved in 4 weeks.

## 2016-07-02 NOTE — Progress Notes (Signed)
  Dana [name] - 56 y.o. female MRN [id number]  Date of birth: 10/04/1960  SUBJECTIVE:  Including CC & ROS.  CC: left foot pain   Complains of left foot pain that began after jumping onto a brick wall in December.  She was seen last month and diagnosed with a bone bruise of the calcaneous.  She was given heel cups, which have not improved her pain.  She reports that her pain is overall improved and she is walking with less of a limp.  No new injury.  Pain on medial aspect of calcaneous.  ROS: No unexpected weight loss, fever, chills, swelling, instability, muscle pain, numbness/tingling, redness, otherwise see HPI   PMHx - Updated and reviewed.  Contributory factors include: Negative PSHx - Updated and reviewed.  Contributory factors include:  Negative FHx - Updated and reviewed.  Contributory factors include:  Negative Social Hx - Updated and reviewed. Contributory factors include: Negative Medications - reviewed   DATA REVIEWED: Previous office visits  PHYSICAL EXAM:  VS: BP:135/75  HR:73bpm  TEMP: ( )  RESP:   HT:5\' 3"  (160 cm)   WT:131 lb (59.4 kg)  BMI:23.3 PHYSICAL EXAM: Gen: NAD, alert, cooperative with exam, well-appearing HEENT: clear conjunctiva,  CV:  no edema, capillary refill brisk, normal rate Resp: non-labored Skin: no rashes, normal turgor  Neuro: no gross deficits.  Psych:  alert and oriented  Ankle & Foot: No visible swelling, ecchymosis, erythema, ulcers, calluses, blister TTP on posterior tibialis tendon and medial calcaneous No tenderness on lateral and medial malleolus Full in plantarflexion, dorsiflexion, inversion, and eversion of the foot; flexion and extension of the toes Strength: 5/5 in all directions. Sensation: intact Vascular: intact w/ dorsalis pedis & posterior tibialis pulses 2+  Ultrasound:  Limited ultrasound of left foot performed.  Plantar fascia viewed with no swelling or tearing.  Medial calcaneous without any hypoechoic changes  superficial to bone.  Posterior tibialis tendon with some hypoechoic changes surrounding tendon, without tearing.  Findings consistent with posterior tibialis irritation.  ASSESSMENT & PLAN:   Pain of left heel Contusion to calcaneous with ongoing swelling along posterior tibialis tendon.  Gave scaphoid pads and sports insole, size 9-10 to help reduce load on posterior tibialis.  Follow up if not improved in 4 weeks.

## 2016-07-30 ENCOUNTER — Encounter: Payer: Self-pay | Admitting: Neurology

## 2016-07-30 ENCOUNTER — Ambulatory Visit (INDEPENDENT_AMBULATORY_CARE_PROVIDER_SITE_OTHER): Payer: 59 | Admitting: Neurology

## 2016-07-30 ENCOUNTER — Telehealth: Payer: Self-pay | Admitting: Neurology

## 2016-07-30 VITALS — BP 124/72 | HR 64 | Ht 63.0 in | Wt 134.0 lb

## 2016-07-30 DIAGNOSIS — G43109 Migraine with aura, not intractable, without status migrainosus: Secondary | ICD-10-CM | POA: Diagnosis not present

## 2016-07-30 DIAGNOSIS — R200 Anesthesia of skin: Secondary | ICD-10-CM | POA: Diagnosis not present

## 2016-07-30 DIAGNOSIS — H8110 Benign paroxysmal vertigo, unspecified ear: Secondary | ICD-10-CM

## 2016-07-30 MED ORDER — MECLIZINE HCL 12.5 MG PO TABS: 12.5000 mg | ORAL_TABLET | Freq: Three times a day (TID) | ORAL | 0 refills | Status: DC | PRN

## 2016-07-30 MED ORDER — SUMATRIPTAN SUCCINATE 100 MG PO TABS: ORAL_TABLET | ORAL | 0 refills | Status: DC

## 2016-07-30 NOTE — Progress Notes (Signed)
NEUROLOGY FOLLOW UP OFFICE NOTE  Dana [name] [id number]  HISTORY OF PRESENT ILLNESS: Dana [name] is a 56 year old left-handed female who follows up for migraine with aura and BPPV.  UPDATE: She has had maybe 2 or 3 migraine attacks over the past year.  When they occur, they occur in clusters.  She would have one, lasting an hour to all day, then feel strange and have another one a couple of days later.  She doesn't take anything for it, just lays down.  She hasn't had any vertigo attacks.  However, sometimes she feels a little dizzy if she sits up too fast or turns over onto her right side in bed.  On a couple of occasions, she reports brief episodes of left facial tingling.  It occurred once in December 2016.  It was thought to be due to her neck.  It occurred again about 2 months ago.  It only lasts a day.  She denied neck pain, facial droop, headache or focal numbness and weakness.  They are not associated with her migraines.   HISTORY: In 2006, she had a viral illness, in which she had syncope and developed diffuse weakness and paresthesias.  Neurologic workup and physical exam was normal.   She has had approximately 3 episodes of vertigo, described as spinning.  In 2012, she woke up one morning with spinning sensation.  It would last a few seconds with head movement.  She also had nasal congestion.  She was treated with antibiotics, which were ineffective.  CT of her sinuses and MRI of brain with and without contrast from April 2012 were unremarkable.  She had a positive Dix-Hallpike and was diagnosed with BPPV.  She had another episode about a year later, which was severe and lasted a few seconds but afterwards she was nauseous and vomited.  MRI and MRA of head from 04/28/12 was unremarkable.  She had one other episode a few months ago, which quickly resolved.  She has been evaluated by ENT and has deviated septum to the right.   She also has migraines.  They usually occur at the  back of her head.  Typically they are mild but ca relieve thn be pounding.  When severe, they are accompanied by nausea and photophobia.  They are preceded by visual aura of zigzag lines.  She reports a prodrome of feeling strange.  They occur for the day but she does not take any pain reliever for it.  They occur once in a while, such as every 2 to several months.  There are no triggers.  Laying down to rest helps it.  PAST MEDICAL HISTORY: Past Medical History:  Diagnosis Date  . Headache   . Vertigo     MEDICATIONS: Current Outpatient Prescriptions on File Prior to Visit  Medication Sig Dispense Refill  . loratadine (CLARITIN) 10 MG tablet Take 10 mg by mouth daily. Reported on 05/25/2015     No current facility-administered medications on file prior to visit.     ALLERGIES: Allergies  Allergen Reactions  . Prednisone     Feels like skin crawls    FAMILY HISTORY: Family History  Problem Relation Age of Onset  . Hypertension Mother   . Hypertension Father   . Cancer Father   . Heart disease Father   . Diabetes Father   . Parkinsonism Paternal Grandfather     SOCIAL HISTORY: Social History   Social History  . Marital status: Married  Spouse name: N/A  . Number of children: N/A  . Years of education: N/A   Occupational History  . Not on file.   Social History Main Topics  . Smoking status: Never Smoker  . Smokeless tobacco: Never Used  . Alcohol use Not on file  . Drug use: Unknown  . Sexual activity: Not on file   Other Topics Concern  . Not on file   Social History Narrative  . No narrative on file    REVIEW OF SYSTEMS: Constitutional: No fevers, chills, or sweats, no generalized fatigue, change in appetite Eyes: No visual changes, double vision, eye pain Ear, nose and throat: No hearing loss, ear pain, nasal congestion, sore throat Cardiovascular: No chest pain, palpitations Respiratory:  No shortness of breath at rest or with exertion,  wheezes GastrointestinaI: No nausea, vomiting, diarrhea, abdominal pain, fecal incontinence Genitourinary:  No dysuria, urinary retention or frequency Musculoskeletal:  No neck pain, back pain Integumentary: No rash, pruritus, skin lesions Neurological: as above Psychiatric: No depression, insomnia, anxiety Endocrine: No palpitations, fatigue, diaphoresis, mood swings, change in appetite, change in weight, increased thirst Hematologic/Lymphatic:  No purpura, petechiae. Allergic/Immunologic: no itchy/runny eyes, nasal congestion, recent allergic reactions, rashes  PHYSICAL EXAM: Vitals:   07/30/16 0911  BP: 124/72  Pulse: 64   General: No acute distress.  Patient appears well-groomed.  normal body habitus. Head:  Normocephalic/atraumatic Eyes:  Fundi examined but not visualized Neck: supple, no paraspinal tenderness, full range of motion Heart:  Regular rate and rhythm Lungs:  Clear to auscultation bilaterally Back: No paraspinal tenderness Neurological Exam: alert and oriented to person, place, and time. Attention span and concentration intact, recent and remote memory intact, fund of knowledge intact.  Speech fluent and not dysarthric, language intact.  CN II-XII intact. Bulk and tone normal, muscle strength 5/5 throughout.  Sensation to light touch  intact.  Deep tendon reflexes 2+ throughout.  Finger to nose testing intact.  Gait normal  IMPRESSION: 1.  Migraine with aura 2.  BPPV 3.  Intermittent left facial tingling.  Infrequent.  Unclear etiology.  No objective symptoms.  PLAN: 1.  Will prescribe meclizine to have on-hand for BPPV. 2.  Will prescribe sumatriptan 100mg  for migraine attack 3.  Monitor facial tingling.  If it becomes a concern, she will contact us and we can order MRI of brain with and without contrast. 4.  Otherwise, follow up in one year.  Shon Millet, DO  CC:  Tracey Harries, MD

## 2016-07-30 NOTE — Telephone Encounter (Signed)
Please advise 

## 2016-07-30 NOTE — Patient Instructions (Signed)
  1.  If you have a vertigo attack that does not resolve, may take meclizine as directed. 2.  Take sumatriptan 100mg  at earliest onset of migraine.  May repeat dose once in 2 hours if needed.  Do not exceed 2 tablets in 24 hours. 3.  Monitor the facial numbness.  If it becomes a concern, contact me 4.  Follow up in 1 year or as needed.

## 2016-07-30 NOTE — Telephone Encounter (Signed)
Pt called and said she is going on a cruise and do you have any suggestions to help her from getting vertigo on the ship.  Please call.

## 2016-07-31 NOTE — Telephone Encounter (Signed)
We can prescribe her scopolamine patch, which helps prevent seasickness.  She puts one patch on at least 4 hour prior to boarding the cruise and changes the patch every 3 days (she should not cut the patch and should not put the patch over the same area as the previous patch).

## 2016-07-31 NOTE — Telephone Encounter (Signed)
Patient made aware.  She hasn't decided if they are going yet so she will call back if she would like the prescription sent in.

## 2016-09-01 ENCOUNTER — Ambulatory Visit (INDEPENDENT_AMBULATORY_CARE_PROVIDER_SITE_OTHER): Payer: 59 | Admitting: Sports Medicine

## 2016-09-01 ENCOUNTER — Encounter: Payer: Self-pay | Admitting: Sports Medicine

## 2016-09-01 VITALS — BP 138/64 | Ht 63.0 in | Wt 130.0 lb

## 2016-09-01 DIAGNOSIS — M79672 Pain in left foot: Secondary | ICD-10-CM | POA: Diagnosis not present

## 2016-09-01 DIAGNOSIS — G8929 Other chronic pain: Secondary | ICD-10-CM

## 2016-09-01 MED ORDER — AMITRIPTYLINE HCL 25 MG PO TABS: 25.0000 mg | ORAL_TABLET | Freq: Every day | ORAL | 1 refills | Status: DC

## 2016-09-01 NOTE — Progress Notes (Signed)
CC; Pain in left foot  Patient first saw [name] in January She had struck left heel hard on brick wall Findings of first exam suggested calcaneal contusion PF looked normal  Did not improve by the visit in Feb. We added sports insoles Some swelling over post tib on that visit noted on [name]  Still not better in March Saw Dr Hal Neer in Podiatry Series of XRs normal Sent to PT 10 sessions of PT w [name] and still having pain Not severe - able to walk 1 mpd  ROS Feels some burning No swelling No pain first step OOB  PE Pleasant F in NAD BP 138/64   Ht  (1.6 m)   Wt 130 lb (59 kg)   BMI 23.03 kg/m   Ankle: No visible erythema or swelling. Range of motion is full in all directions. Strength is 5/5 in all directions. Stable lateral and medial ligaments; squeeze test and kleiger test unremarkable; Talar dome nontender; No pain at base of 5th MT; No tenderness over cuboid; No tenderness over N spot or navicular prominence No tenderness on posterior aspects of lateral and medial malleolus No sign of peroneal tendon subluxations; Negative tarsal tunnel tinel's Able to walk 4 steps.  No TTP over calcaneus No tinels over tibial n No swelling

## 2016-09-01 NOTE — Patient Instructions (Signed)
I think your heel contusion probably bruised the medial calcaneal nerve branch I don't see evidence of plantar fasciitis  No fracture evidence  Try using Capzin (capsaician cream) to heel 3 times a day  Use amitriptyline at night for 6 weeks  You may gradually increase walking and activity if pain is controlled  Calf raises would be helpful to keep that muscle strong  Let me know how this does over next 6 weeks

## 2016-09-01 NOTE — Assessment & Plan Note (Signed)
With no findings on XR/ [name] or exam I am suspicious of med. Calcaneal nerve contusion  Trial on amitriptyline 25 qhs Capzin tid  Walk to tolerance  Heat and avoid ice  Reck if not better 6 weeks

## 2016-09-07 ENCOUNTER — Other Ambulatory Visit: Payer: Self-pay | Admitting: *Deleted

## 2016-09-07 MED ORDER — MELOXICAM 15 MG PO TABS: 15.0000 mg | ORAL_TABLET | Freq: Every day | ORAL | 1 refills | Status: DC

## 2016-10-01 ENCOUNTER — Ambulatory Visit: Payer: 59 | Admitting: Sports Medicine

## 2016-10-13 ENCOUNTER — Ambulatory Visit: Payer: 59 | Admitting: Sports Medicine

## 2016-10-20 ENCOUNTER — Ambulatory Visit (INDEPENDENT_AMBULATORY_CARE_PROVIDER_SITE_OTHER): Payer: 59 | Admitting: Sports Medicine

## 2016-10-20 ENCOUNTER — Encounter: Payer: Self-pay | Admitting: Sports Medicine

## 2016-10-20 DIAGNOSIS — R269 Unspecified abnormalities of gait and mobility: Secondary | ICD-10-CM | POA: Diagnosis not present

## 2016-10-20 DIAGNOSIS — M79672 Pain in left foot: Secondary | ICD-10-CM

## 2016-10-20 NOTE — Patient Instructions (Signed)
[name],  Your ultrasound and symptoms are consistent with mild plantar fascitis; it is about 20-30% more swollen than normal. Your calcaneal nerve swelling is improving but it is at the upper limits of normal.  You can try to ice the heal but only on the bottom of your foot; avoid ice on the inside of your foot. Do the exercises to stretch your big toe and foot out we gave you today. Use NSAIDs as needed for pain.   Come back in 2-3 months for re-check.

## 2016-10-20 NOTE — Progress Notes (Signed)
   Subjective:    Patient ID: Dana [name], female    DOB: 1961/04/13, 56 y.o.   MRN: [id number]  HPI [name] is a 56 year old female with complaints of left heel pain. She was first evaluated in January following an injury to her heel from hitting hard on a brick wall. She has had normal XR and ultrasounds. Suspected calcaneal nerve contusion and was prescribed capsicin cream and amitriptyline. She reports not trying either medication. She has been to PT and had some improvement in her pain with medial calf massage. She still complains of medial heel pain. Pain is worse when wearing shoes. She has now developed some medial pain of her rear foot that is worst in the morning or when sedentary for extended periods and improves after walking and stretching it out.    She also reports hitting her 2nd left toe on a table recently with erythema, warmth and edema. Symptoms resolved after about 1 week and no pain currently. Pictures show discoloration of toe at MTP.  Review of Systems Pain with first step OOB No numbness or tingling No weakness    Objective:   Physical Exam  Musculoskeletal:  Left Ankle: No visible erythema or swelling ROM full in all directions Strength 5/5 and intact Stable lateral and medial ligaments; squeeze test and kleiger test unremarkable; Talar dome nontender; No pain at base of 5th MT; No tenderness over cuboid; No tenderness over N spot or navicular prominence No TTP over calcaneus  L 2nd toe No TPP Full ROM     Ultrasound of Left foot  Evidence of mild plantar fascitis with PF thickness of 0.61 Tibial nerve edema improving; at upper limits of normal on volume No evidence of any fracture at 2nd MT joint  Ultrasound consistent with early plantar fasciitis on RT  Ultrasound and interpretation by Dana HawthornKarl B. Fields, MD      Assessment & Plan:   Left Heel Pain  Developed mild plantar fascitis likely from compensation from calcaneal nerve contusion  injury Provided stretching exercises today Okay to ice heel but avoid medial aspect given nerve injury NSAIDs prn Shoe insert provided Follow up in 2-3 months for re-check  Left 2nd toe contusion No evidence of fracture on exam Follow up if needed

## 2016-10-20 NOTE — Assessment & Plan Note (Signed)
I suspect she developed the plantar fasciitis from changing her gait during the heel contusion  We tried sports insoles with a heel lift 3/16 inch Arch strap And we tried her normal shoe insoles  She wasn't sure which of these conditions felt most comfortable but will alternate days until she sees where she gets most pain relief  Start on a plantar fascial exercise and stretching series

## 2016-10-29 ENCOUNTER — Ambulatory Visit: Payer: 59 | Admitting: Sports Medicine

## 2016-12-22 ENCOUNTER — Ambulatory Visit (INDEPENDENT_AMBULATORY_CARE_PROVIDER_SITE_OTHER): Payer: 59 | Admitting: Sports Medicine

## 2016-12-22 DIAGNOSIS — M79672 Pain in left foot: Secondary | ICD-10-CM | POA: Diagnosis not present

## 2016-12-22 NOTE — Progress Notes (Signed)
Left Foot Pain  Chronic left foot pain after contusion on brick wall January 2018 This remained painful Seemed to have neurogenic pain Never took amitriptylne as suggested Did go to PT after visit in May showed she had also developed PF on that side In jan. PF had been normal She felt this was from walking funny  With PT and rest - took a cruise, etc pain is much less Still gets a periodic sharp pain more to outside of left heel Sometimes hurts up into the calf  ROS No numbness Some burning into heel at times More comfortable bare foot or in sandals No first step pain now  PE Pleasant F in NAD BP 112/74   Ht 5' 3.5" (1.613 m)   Wt 130 lb (59 kg)   BMI 22.67 kg/m   No TTP at insertion of PF Good great toe movement Left calf is 1 cm smaller No + tinel sign at tarsal tunnel of laterally Calf is not TTP Walking gait now looks normal without limp

## 2016-12-22 NOTE — Assessment & Plan Note (Signed)
Plnatar fasciitis seems to have resolved  I think the soft tissue injury likely contused nerve This may remain hypersensitive for a time  Trial with soft Tuli heel cups for walking Calf raises on step No icing at this point Vit B6  Reck if not resolved in 3 mos

## 2016-12-22 NOTE — Patient Instructions (Signed)
I think you still have a nerve contusion that is hypersensitive to the left heel  Try a soft heel cup when walking  Calf raise exercises after walking on toes and backwards x 10  Vitamin B 6 use 100 mgm per day  Don't ice the heel  Go ahead and do whatever you can that does not make this worse as I do not think the plantar fascia is the major issue now  See if this resolves over 3 months

## 2017-07-30 ENCOUNTER — Encounter: Payer: Self-pay | Admitting: Neurology

## 2017-07-30 ENCOUNTER — Ambulatory Visit (INDEPENDENT_AMBULATORY_CARE_PROVIDER_SITE_OTHER): Payer: 59 | Admitting: Neurology

## 2017-07-30 VITALS — BP 110/80 | HR 83 | Ht 63.5 in | Wt 131.0 lb

## 2017-07-30 DIAGNOSIS — H8110 Benign paroxysmal vertigo, unspecified ear: Secondary | ICD-10-CM

## 2017-07-30 DIAGNOSIS — G5603 Carpal tunnel syndrome, bilateral upper limbs: Secondary | ICD-10-CM | POA: Diagnosis not present

## 2017-07-30 DIAGNOSIS — G43109 Migraine with aura, not intractable, without status migrainosus: Secondary | ICD-10-CM

## 2017-07-30 MED ORDER — AMBULATORY NON FORMULARY MEDICATION: 0 refills | Status: DC

## 2017-07-30 NOTE — Progress Notes (Signed)
NEUROLOGY FOLLOW UP OFFICE NOTE  Dana [name] [id number]  HISTORY OF PRESENT ILLNESS: Dana [name] is a 57 year old left-handed female who follows up for migraine with aura and BPPV.   UPDATE: She has had maybe 2 or 3 migraine attacks over the past year.  They are mild-moderate intensity and last a couple of hours.  They are not severe enough to take sumatriptan.  She hasn't tried sumatriptan because her sister doesn't like it and made her feel uneasy about it.   She hasn't had any vertigo attacks.  However, sometimes she feels a little dizzy if she sits up too fast or turns over onto her right side in bed.   For several weeks, she reports numbness and tingling in the right upper extremity.  She particularly notices it in bed.  She notes in her fingers and sometimes radiates up the forearm to the elbow.  She denies radicular pain down the arm.  She denies weakness.  She needs to shake it out and it resolves after a couple of minutes.  She has mild symptoms in the left arm and has been diagnosed with carpal tunnel syndrome in the left hand several years ago.  She also occasionally has tight sensation in the left posterior of aspiect of her neck, radiating up to the suboccipital region and into shoulder.  No associated numbness, shooting pain or weakness into the arm.  She sees a physical therapist who works on her neck.     HISTORY: In 2006, she had a viral illness, in which she had syncope and developed diffuse weakness and paresthesias.  Neurologic workup and physical exam was normal.   I  BPPV:  She has had approximately 3 episodes of vertigo, described as spinning.  In 2012, she woke up one morning with spinning sensation.  It would last a few seconds with head movement.  She also had nasal congestion.  She was treated with antibiotics, which were ineffective.  CT of her sinuses and MRI of brain with and without contrast from April 2012 were unremarkable.  She had a positive Dix-Hallpike  and was diagnosed with BPPV.  She had another episode about a year later, which was severe and lasted a few seconds but afterwards she was nauseous and vomited.  MRI and MRA of head from 04/28/12 was unremarkable.  She had one other episode a few months ago, which quickly resolved.  She has been evaluated by ENT and has deviated septum to the right.   II  MIGRAINES:  She also has migraines.  They usually occur at the back of her head.  Typically they are mild but ca relieve thn be pounding.  When severe, they are accompanied by nausea and photophobia.  They are preceded by visual aura of zigzag lines.  She reports a prodrome of feeling strange.  They occur for the day but she does not take any pain reliever for it.  They occur once in a while, such as every 2 to several months.  There are no triggers.  Laying down to rest helps it.  III  INTERMITTENT LEFT FACIAL TINGLING:  On a couple of occasions, she reports brief episodes of left facial tingling.  It occurred once in December 2016.  It was thought to be due to her neck.  It occurred again about 2 months ago.  It only lasts a day.  She denied neck pain, facial droop, headache or focal numbness and weakness.  They are not associated  with her migraines.  PAST MEDICAL HISTORY: Past Medical History:  Diagnosis Date  . Headache   . Vertigo     MEDICATIONS: Current Outpatient Medications on File Prior to Visit  Medication Sig Dispense Refill  . ibuprofen (ADVIL,MOTRIN) 200 MG tablet Take 200 mg by mouth every 6 (six) hours as needed.    . loratadine (CLARITIN) 10 MG tablet Take 10 mg by mouth daily. Reported on 05/25/2015    . meclizine (ANTIVERT) 12.5 MG tablet Take 1 tablet (12.5 mg total) by mouth 3 (three) times daily as needed for dizziness. 30 tablet 0   No current facility-administered medications on file prior to visit.     ALLERGIES: Allergies  Allergen Reactions  . Prednisone     Feels like skin crawls    FAMILY HISTORY: Family  History  Problem Relation Age of Onset  . Hypertension Mother   . Hypertension Father   . Cancer Father   . Heart disease Father   . Diabetes Father   . Parkinsonism Paternal Grandfather     SOCIAL HISTORY: Social History   Socioeconomic History  . Marital status: Married    Spouse name: Not on file  . Number of children: Not on file  . Years of education: Not on file  . Highest education level: Not on file  Social Needs  . Financial resource strain: Not on file  . Food insecurity - worry: Not on file  . Food insecurity - inability: Not on file  . Transportation needs - medical: Not on file  . Transportation needs - non-medical: Not on file  Occupational History  . Not on file  Tobacco Use  . Smoking status: Never Smoker  . Smokeless tobacco: Never Used  Substance and Sexual Activity  . Alcohol use: Yes    Comment: occasionally  . Drug use: Not on file  . Sexual activity: Not on file  Other Topics Concern  . Not on file  Social History Narrative  . Not on file    REVIEW OF SYSTEMS: Constitutional: No fevers, chills, or sweats, no generalized fatigue, change in appetite Eyes: No visual changes, double vision, eye pain Ear, nose and throat: No hearing loss, ear pain, nasal congestion, sore throat Cardiovascular: No chest pain, palpitations Respiratory:  No shortness of breath at rest or with exertion, wheezes GastrointestinaI: No nausea, vomiting, diarrhea, abdominal pain, fecal incontinence Genitourinary:  No dysuria, urinary retention or frequency Musculoskeletal:  No neck pain, back pain Integumentary: No rash, pruritus, skin lesions Neurological: as above Psychiatric: No depression, insomnia, anxiety Endocrine: No palpitations, fatigue, diaphoresis, mood swings, change in appetite, change in weight, increased thirst Hematologic/Lymphatic:  No purpura, petechiae. Allergic/Immunologic: no itchy/runny eyes, nasal congestion, recent allergic reactions,  rashes  PHYSICAL EXAM: Vitals:   07/30/17 0920  BP: 110/80  Pulse: 83   General: No acute distress.  Patient appears well-groomed.  normal body habitus. Head:  Normocephalic/atraumatic Eyes:  Fundi examined but not visualized Neck: supple, no paraspinal tenderness, full range of motion Heart:  Regular rate and rhythm Lungs:  Clear to auscultation bilaterally Back: No paraspinal tenderness Neurological Exam: alert and oriented to person, place, and time. Attention span and concentration intact, recent and remote memory intact, fund of knowledge intact.  Speech fluent and not dysarthric, language intact.  CN II-XII intact. Bulk and tone normal, muscle strength 5/5 throughout.  Sensation to light touch  intact.  Deep tendon reflexes 2+ throughout.  Finger to nose testing intact.  Gait normal, Romberg negative.  IMPRESSION: 1. Migraine with aura, stable 2.  BPPV stable 3.  Probable carpal tunnel syndrome  PLAN: 1.  Will prescribe wrist splints for night.  If not helpful, we can schedule for NCV-EMG of upper extremities. 2.  Otherwise, follow up in one year.  25 minutes spent face to face with patient, over 50% spent discussing management.  Shon Millet, DO  CC:  Dr. Everlene Other

## 2017-07-30 NOTE — Patient Instructions (Signed)
1.  I will prescribe you wrist splints to wear at night.  If not helpful, contact us to schedule nerve test. 2.  Otherwise, follow up in one year.

## 2017-08-24 ENCOUNTER — Telehealth: Payer: Self-pay

## 2017-08-24 NOTE — Telephone Encounter (Signed)
SENT REFERRAL TO SCHEDULING 

## 2017-09-17 ENCOUNTER — Encounter: Payer: Self-pay | Admitting: Neurology

## 2017-09-29 ENCOUNTER — Encounter: Payer: Self-pay | Admitting: Cardiology

## 2017-09-29 ENCOUNTER — Ambulatory Visit (INDEPENDENT_AMBULATORY_CARE_PROVIDER_SITE_OTHER): Payer: 59 | Admitting: Cardiology

## 2017-09-29 VITALS — BP 114/80 | HR 67 | Ht 63.5 in | Wt 127.0 lb

## 2017-09-29 DIAGNOSIS — R072 Precordial pain: Secondary | ICD-10-CM | POA: Diagnosis not present

## 2017-09-29 DIAGNOSIS — Z8249 Family history of ischemic heart disease and other diseases of the circulatory system: Secondary | ICD-10-CM | POA: Diagnosis not present

## 2017-09-29 DIAGNOSIS — Z01818 Encounter for other preprocedural examination: Secondary | ICD-10-CM | POA: Diagnosis not present

## 2017-09-29 DIAGNOSIS — R0609 Other forms of dyspnea: Secondary | ICD-10-CM | POA: Diagnosis not present

## 2017-09-29 DIAGNOSIS — R0600 Dyspnea, unspecified: Secondary | ICD-10-CM | POA: Insufficient documentation

## 2017-09-29 HISTORY — DX: Family history of ischemic heart disease and other diseases of the circulatory system: Z82.49

## 2017-09-29 HISTORY — DX: Dyspnea, unspecified: R06.00

## 2017-09-29 HISTORY — DX: Other forms of dyspnea: R06.09

## 2017-09-29 MED ORDER — METOPROLOL TARTRATE 50 MG PO TABS: 50.0000 mg | ORAL_TABLET | Freq: Once | ORAL | 0 refills | Status: DC

## 2017-09-29 NOTE — Patient Instructions (Signed)
NO MEDICATION CHANGES   SCHEDULE AT  [facility] -RADIOLOGY Your physician has requested that you have cardiac CT. Cardiac computed tomography (CT) is a painless test that uses an x-ray machine to take clear, detailed pictures of your heart. For further information please visit [email or web address] Please follow instruction sheet as given.  .Your physician recommends that you schedule a follow-up appointment in 2 MONTHS WITH DR HARDING.     INSTRUCTIONS FOR  CORONARY CTA    Please arrive at the [facility] main entrance of [facility] at (30-45 minutes prior to test start time)  [facility] [address] [place], [place] [address] [phone]  Proceed to the [facility] Radiology Department (First Floor).  Please follow these instructions carefully (unless otherwise directed):  PLEASE HAVE LABS - BMP  AT LEAST ONE WEEK PRIOR TO TEST  On the Night Before the Test: . Drink plenty of water. . Do not consume any caffeinated/decaffeinated beverages or chocolate 12 hours prior to your test. . Do not take any antihistamines 12 hours prior to your test.    On the Day of the Test: . Drink plenty of water. Do not drink any water within one hour of the test. . Do not eat any food 4 hours prior to the test. . You may take your regular medications prior to the test.  Take 50 mg of lopressor (metoprolol) one hour before the test.   After the Test: . Drink plenty of water. . After receiving IV contrast, you may experience a mild flushed feeling. This is normal. . On occasion, you may experience a mild rash up to 24 hours after the test. This is not dangerous. If this occurs, you can take Benadryl 25 mg and increase your fluid intake. . If you experience trouble breathing, this can be serious. If it is severe call 911 IMMEDIATELY. If it is mild, please call our office.

## 2017-09-29 NOTE — Progress Notes (Signed)
PCP: Dana Harries, MD  Clinic Note: Chief Complaint  Patient presents with  . New Patient (Initial Visit)    Cardiovascular evaluation    HPI: Dana [name] is a 57 y.o. female who is being seen today for the evaluation of baseline cardiovascular risk with family history of CAD at the request of Dana Harries, MD.  Dana [name] was last seen on Oct 08, 2010 due to history of dizziness and family history of CAD. -->  She had previously been seen by Dr. Jenne [name] for evaluation of syncope back in 2005 occurred while jogging and bending down to pick something up.  This was thought to be orthostatic hypotension mediated.  Evaluated with an echocardiogram and stress test that were both negative.  When I saw her in 2012, it sounds like she is having vertigo type dizziness.  There was concern of may be short PR interval on EKG it may be consistent with possibly having an accessory pathway, but she has not had any palpitations.  He was seen by Dr. Everlene [name] for routine annual follow-up visits back in March.  As part of her review of symptoms, she indicated having some intermittent episodes of chest discomfort and is now referred for reevaluation by cardiology.  Recent Hospitalizations: None  Studies Personally Reviewed - (if available, images/films reviewed: From Epic Chart or Care Everywhere) Prior studies from 2005:-   Echocardiogram: Normal LV size and function.  EF>55%.  No mitral valve prolapse or regurgitation.  No wall motion abnormality  Treadmill Cardiolite: Nonischemic  Interval History: Dana [name] presents today mostly concerned that her father had CAD diagnosed in his 60s/early 27s and she has aggressive hyperlipidemia based on her LDL of 126 back in 2016.  She has not been really doing her routine exercise of late, basically got out of the habit of doing it because she been having some GI issues and bad back issues.  Now that the symptoms somewhat resolved she is hoping to get back into  doing exercise including playing pickle ball.  She has started playing pickle ball and has not noted any notable symptoms of chest tightness or pressure.  She does have a little bit of exertional dyspnea, but she herself indicates that she is "out of shape". She has not really had exertional chest pain but has had intermittent episodes of Twinging in her chest off and on that may or may not be related to any particular activity. She has baseline vertigo but has not had any syncope or near syncope.  Remainder of her cardiac review of symptoms is essentially negative as follows:  No PND, orthopnea or edema.  No palpitations, lightheadedness, dizziness, weakness or syncope/near syncope. No TIA/amaurosis fugax symptoms. No claudication.  ROS: A comprehensive was performed. Review of Systems  Constitutional: Negative for chills, fever and malaise/fatigue.  HENT: Negative for nosebleeds.   Respiratory: Negative for cough, shortness of breath and wheezing.   Cardiovascular: Negative for claudication and leg swelling.  Gastrointestinal: Positive for abdominal pain and constipation. Negative for blood in stool and melena.  Genitourinary: Negative for hematuria.  Musculoskeletal: Negative for falls and joint pain.  Neurological: Positive for dizziness (Vertigo). Negative for weakness.  Psychiatric/Behavioral: Negative.   All [name] systems reviewed and are negative.   I have reviewed and (if needed) personally updated the patient's problem list, medications, allergies, past medical and surgical history, social and family history.   Past Medical History:  Diagnosis Date  . Headache   . Vertigo  History reviewed. No pertinent surgical history.  Current Meds  Medication Sig  . AMBULATORY NON FORMULARY MEDICATION Medication Name: wrist splints left and/or right  . ibuprofen (ADVIL,MOTRIN) 200 MG tablet Take 200 mg by mouth every 6 (six) hours as needed.  . loratadine (CLARITIN) 10 MG  tablet Take 10 mg by mouth daily. Reported on 05/25/2015  . meclizine (ANTIVERT) 12.5 MG tablet Take 1 tablet (12.5 mg total) by mouth 3 (three) times daily as needed for dizziness.    Allergies  Allergen Reactions  . Prednisone     Feels like skin crawls    Social History   Tobacco Use  . Smoking status: Never Smoker  . Smokeless tobacco: Never Used  Substance Use Topics  . Alcohol use: Yes    Comment: occasionally  . Drug use: Never   Social History   Social History Narrative  . Not on file    family history includes Cancer in her father; Coronary artery disease in her father; Diabetes in her father; High Cholesterol (age of onset: 54) in her brother; Hypertension in her father and mother; Parkinsonism in her paternal grandfather.  Wt Readings from Last 3 Encounters:  09/29/17 127 lb (57.6 kg)  07/30/17 131 lb (59.4 kg)  12/22/16 130 lb (59 kg)    PHYSICAL EXAM BP 114/80   Pulse 67   Ht 5' 3.5" (1.613 m)   Wt 127 lb (57.6 kg)   BMI 22.14 kg/m  Physical Exam  Constitutional: She is oriented to person, place, and time. She appears well-developed and well-nourished. No distress.  She is healthy-appearing.  Well-groomed   HENT:  Head: Normocephalic and atraumatic.  Eyes: Pupils are equal, round, and reactive to light. Conjunctivae and EOM are normal.  Neck: Normal range of motion. Neck supple. No hepatojugular reflux and no JVD present. Carotid bruit is not present.  Cardiovascular: Normal rate, regular rhythm, normal heart sounds, intact distal pulses and normal pulses.  No extrasystoles are present. PMI is not displaced. Exam reveals no gallop and no friction rub.  No murmur heard. Pulmonary/Chest: Effort normal and breath sounds normal. No respiratory distress. She has no wheezes. She has no rales. She exhibits no tenderness.  Abdominal: Soft. Bowel sounds are normal. She exhibits no distension. There is no tenderness. There is no rebound.  No HSM    Musculoskeletal: Normal range of motion. She exhibits no edema or deformity.  Neurological: She is alert and oriented to person, place, and time.  Skin: Skin is warm and dry.  Psychiatric: She has a normal mood and affect. Her behavior is normal. Judgment and thought content normal.     Adult ECG Report  Rate: 67 ;  Rhythm: normal sinus rhythm and Normal axis, intervals and durations;   Narrative Interpretation: Normal EKG   [name] studies Reviewed: Additional studies/ records that were reviewed today include:  Recent Labs:  (Care Everywhere)- no Lipids etc.   ASSESSMENT / PLAN: Problem List Items Addressed This Visit    Pre-op testing - Primary   Relevant Orders   Basic metabolic panel   Family history of early CAD   Relevant Orders   EKG 12-Lead (Completed)   Basic metabolic panel   CT CORONARY MORPH W/CTA COR W/SCORE W/CA W/CM &/OR WO/CM   CT CORONARY FRACTIONAL FLOW RESERVE DATA PREP   CT CORONARY FRACTIONAL FLOW RESERVE FLUID ANALYSIS   DOE (dyspnea on exertion)   Relevant Orders   EKG 12-Lead (Completed)   Basic metabolic panel  CT CORONARY MORPH W/CTA COR W/SCORE W/CA W/CM &/OR WO/CM   CT CORONARY FRACTIONAL FLOW RESERVE DATA PREP   CT CORONARY FRACTIONAL FLOW RESERVE FLUID ANALYSIS   Chest pain, precordial   Relevant Orders   EKG 12-Lead (Completed)   CT CORONARY MORPH W/CTA COR W/SCORE W/CA W/CM &/OR WO/CM   CT CORONARY FRACTIONAL FLOW RESERVE DATA PREP   CT CORONARY FRACTIONAL FLOW RESERVE FLUID ANALYSIS     Dana [name] is a relatively healthy middle-aged woman with dyslipidemia and a family history of CAD that is not necessarily premature.  She was hoping to have another cardiology evaluation for baseline assessment prior to getting into an exercise routine.  She is having some intermittent episodes of chest discomfort.  She also has some exertional dyspnea.  I discussed with her options on how to evaluate this and I do not think that it treadmill stress test will be  very helpful because she probably will likely pass without any ischemic changes.  As such I do not think a Myoview would be reasonable either in the absence of symptoms.  For baseline screening coronary calcium score would be reasonable, and a coronary CT angiogram would be more definitive as far as evaluating her baseline cardiovascular risk.  I suggested a coronary calcium score with CT angiogram and she decided to take it under advisement.  Apparently after discussing with her husband upon going home, she decided to forego the CT scan. Recommendation would be monitoring cardiac risks including her lipid panel and treated accordingly.  She is not actively having notable symptoms at this point, therefore I see no reason for stress test.  However, if symptoms were to recur, we could consider a coronary CT angiogram. -  At a minimum coronary calcium score.  She would like to maintain a presents with cardiology and therefore we will have her follow-up in 1 to 2 years.  I spent a total of 40 minutes with the patient and chart review. >  50% of the time was spent in direct patient consultation.   Current medicines are reviewed at length with the patient today.  (+/- concerns) n/a The following changes have been made:  n/a  Patient Instructions  NO MEDICATION CHANGES   SCHEDULE AT  [facility] -RADIOLOGY Your physician has requested that you have cardiac CT. Cardiac computed tomography (CT) is a painless test that uses an x-ray machine to take clear, detailed pictures of your heart. For further information please visit [email or web address] Please follow instruction sheet as given.  .Your physician recommends that you schedule a follow-up appointment in 2 MONTHS WITH DR Diontay Rosencrans.     INSTRUCTIONS FOR  CORONARY CTA    Studies Ordered:   Orders Placed This Encounter  Procedures  . CT CORONARY MORPH W/CTA COR W/SCORE W/CA W/CM &/OR WO/CM  . CT CORONARY FRACTIONAL FLOW RESERVE DATA PREP  .  CT CORONARY FRACTIONAL FLOW RESERVE FLUID ANALYSIS  . Basic metabolic panel  . EKG 12-Lead   Coronary CTA canceled--    Bryan Lemma, M.D., M.S. Interventional Cardiologist   Pager # [phone] Phone # [phone] [address] [place], [place] [address]   Thank you for choosing [facility] at [facility]!!

## 2017-09-30 ENCOUNTER — Telehealth: Payer: Self-pay | Admitting: Cardiology

## 2017-09-30 ENCOUNTER — Encounter: Payer: Self-pay | Admitting: Cardiology

## 2017-09-30 DIAGNOSIS — R072 Precordial pain: Secondary | ICD-10-CM | POA: Insufficient documentation

## 2017-09-30 DIAGNOSIS — Z136 Encounter for screening for cardiovascular disorders: Secondary | ICD-10-CM | POA: Insufficient documentation

## 2017-09-30 DIAGNOSIS — Z01818 Encounter for other preprocedural examination: Secondary | ICD-10-CM | POA: Insufficient documentation

## 2017-09-30 HISTORY — DX: Precordial pain: R07.2

## 2017-09-30 NOTE — Telephone Encounter (Signed)
Pt states that after she dicussed with her husband, she want to decline having the Coronary CT. She states she is not currently having symptoms and would prefer to hold off for now. Routed to Dr. Herbie Baltimore and Nurse.

## 2017-09-30 NOTE — Telephone Encounter (Signed)
New message:       Pt is calling to decline the offer of doing a CT Scan. Pt states she discussed it with her spouse and they agreed she does not want to do it. Pt states if you have any questions or concerns please call her back

## 2017-09-30 NOTE — Telephone Encounter (Signed)
Ok - sounds fine.   DH

## 2017-09-30 NOTE — Telephone Encounter (Signed)
Spoke with pt and advised that Dr. Herbie Baltimore is ok with her not proceeding with the test.

## 2017-12-03 ENCOUNTER — Ambulatory Visit: Payer: 59 | Admitting: Cardiology

## 2017-12-23 ENCOUNTER — Ambulatory Visit (INDEPENDENT_AMBULATORY_CARE_PROVIDER_SITE_OTHER): Payer: 59 | Admitting: Sports Medicine

## 2017-12-23 ENCOUNTER — Encounter: Payer: Self-pay | Admitting: Sports Medicine

## 2017-12-23 ENCOUNTER — Encounter

## 2017-12-23 DIAGNOSIS — M542 Cervicalgia: Secondary | ICD-10-CM | POA: Diagnosis not present

## 2017-12-23 DIAGNOSIS — M7502 Adhesive capsulitis of left shoulder: Secondary | ICD-10-CM

## 2017-12-23 HISTORY — DX: Adhesive capsulitis of left shoulder: M75.02

## 2017-12-23 NOTE — Assessment & Plan Note (Signed)
This has made a lot of progress with PT CSI also helped  Keep up ROM We gave her a HEP to use now that PT is winding down

## 2017-12-23 NOTE — Assessment & Plan Note (Signed)
See plan for HEP along with stretches of this region/ Ice

## 2017-12-23 NOTE — Patient Instructions (Signed)
Neck pain - perform sternocleidomastoid neck stretches 2x daily (hold for 10 seconds at a time, tuck chin and hold for another 10 sec) for two weeks  L Frozen Shoulder - Use frozen ice cup to massage areas of soreness 3x per day for two weeks - perform scapula stability exercise where you squeeze your shoulder blades together, use 3lb dumbbells when doing this - Walk your arm up the wall 2x daily - Do chair dip motion 2x daily - Doorway leans 2x daily to regain motion   Follow up in 4 weeks

## 2017-12-23 NOTE — Progress Notes (Signed)
Dana [name] - 57 y.o. female MRN [id number]  Date of birth: 1960-09-24   Chief complaint: Left neck pain and stiffness, left shoulder pain, left scapular pain  SUBJECTIVE:    History of present illness: Patient is a 57 year old female who presents today with several different musculoskeletal complaints.  Her most bothersome complaint is her left shoulder and scapular pain.  She states last November, she received a flu shot in her left deltoid region.  Shortly afterwards, she had some sort of inflammatory response which caused adhesive capsulitis and stiffness in her left shoulder.  She is currently been working with Sharen Hones on gentle range of motion exercises for this.  She reports she has regained some motion.  She did receive a corticosteroid injection in the subacromial and intra-articular space at [facility] which also assisted with this.  Today, she states her shoulder is moderately improved since initiating treatment early February.  She continues to have difficulty scratching her back or reaching behind her back.  Lifting her arm straight forward does not seem as difficult.  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  She denies any numbness or tingling into the arm or hand.  Denies loss of grip strength.  Denies any low back pain.  She has never had anything like this before.  Of note, she did have an MRI performed.  These images were not available for review however per the patient it was negative for rotator cuff injury.  It did show bursitis.  Patient did develop left neck pain and stiffness x1 month.  She states she had no inciting injury.  She did play pickle ball upwards of 6 times per week and felt this may have aggravated it.  She is also been seeing [name] for some chiropractic manipulation.  He has been performing high velocity low amplitude maneuvers.  The patient feels this may have injured her neck however  she has not been having any neurologic symptoms.  The patient has been feeling a pulling discomfort on the left side of her head and around her ear.  This has been bothersome to her and keeps her up at night on occasion.  She did go to urgent care to have further work-up however this was negative.  She was given ibuprofen and famotidine however the patient wishes to not take medications for her issues as she is sensitive to these.  She has never had anything like this before.  Pain is worse with turning her head to the right or bending her head.  Nothing seems to make it any better.   Review of systems:  As above   Interval past medical history, surgical history, family history, and social history obtained and are unchanged.   For her social history, she has been playing pickle ball frequently as this is a new hobby for her.  Is a non-smoker.  She does have allergies to prednisone.  Pertinent positive past medical history include: Vertigo, migraine with aura, insomnia, menopause.  Medication list was reviewed.  OBJECTIVE:  Physical exam: Vital signs are reviewed. BP 122/74   Ht 5\' 3"  (1.6 m)   Wt 125 lb (56.7 kg)   BMI 22.14 kg/m   Gen.: Alert, oriented, appears stated age, in no apparent distress, husband is present HEENT: Moist oral mucosa Respiratory: Normal respirations, able to speak in full sentences Integumentary: No rashes  Neurologic:  Sensation is intact to light touch C5-T1, negative Spurling's test  Gait: normal without associated limp Psych: Fast talker, mood is good Musculoskeletal: Inspection of the left shoulder demonstrates no acute abnormalities or deformities.  Patient has decreased range of motion in shoulder flexion to approximately 170 degrees.  She has decreased external rotation missing approximately 10 degrees of external rotation as compared to the right.  She also has decreased internal rotation to approximately T12.  Strength testing 5 out of 5 in shoulder flexion a  extension.  4.5 out of 5 in shoulder external rotation.  5 out of 5 in shoulder internal rotation.  Negative empty can test.  Discomfort with Hawkins test.  Negative crossarm test.  Negative Neer's test.  Negative speeds and Yergason's testing.  Discomfort with Hornblower's test.  Neck: Tenderness to palpation in the left sternocleidomastoid belly with visible spasm.  Decreased range of motion in rotation of the head to the right and side bending to the left.  Strength testing is limited secondary to pain and discomfort of her sternocleidomastoid.  Carotid pulse +2.   Back: No midline cervical, thoracic, lumbar spine tenderness.    ASSESSMENT & PLAN: 1.  Left sternocleidomastoid muscle spasm 2.  Adhesive capsulitis of the left shoulder, improving   Plan: Given the patient's history and physical exam, I do believe she is suffering from an acute muscle spasm of her left sternocleidomastoid muscle.  This is likely a sequelae from her adhesive capsulitis of her left shoulder with continued physical therapy and rehab.  I am recommending for her neck muscle spasm that she perform neck stretches 2 times daily for the next 2 weeks.  I did detailed these out in her after visit summary and had her demonstrate back to these exercises.  For her adhesive capsulitis, I am recommending she utilize ice to massage the area 3 times per day for the next 2 weeks.  I did offer starting a new medication like an anti-inflammatory or amitriptyline however the patient states because she is sensitive to medication she would would not want to start this.  I also am recommending scapular stability exercises using 3 pound dumbbells.  These were demonstrated to her.  She also is to increase her range of motion of her left shoulder by walking her arm up the wall and helping with both forward flexion and external rotation.  She may do chair dips to help with these.  Due to the extent of her  problems and her concern, I am recommending  follow-up in 4 weeks to reassess her range of motion and level of functioning given these problems.  Gustavus MessingAJ Pinney, DO Sports Medicine Fellow [facility]  I observed and examined the patient with the [facility] and agree with assessment and plan.  Note reviewed and modified by me. Sterling BigKB Mozell Haber, MD

## 2017-12-30 ENCOUNTER — Ambulatory Visit (INDEPENDENT_AMBULATORY_CARE_PROVIDER_SITE_OTHER): Payer: 59 | Admitting: Sports Medicine

## 2017-12-30 ENCOUNTER — Encounter: Payer: Self-pay | Admitting: Sports Medicine

## 2017-12-30 VITALS — BP 116/80 | Ht 63.0 in | Wt 124.0 lb

## 2017-12-30 DIAGNOSIS — M7912 Myalgia of auxiliary muscles, head and neck: Secondary | ICD-10-CM

## 2017-12-30 DIAGNOSIS — M2170 Unequal limb length (acquired), unspecified site: Secondary | ICD-10-CM

## 2017-12-30 DIAGNOSIS — G479 Sleep disorder, unspecified: Secondary | ICD-10-CM

## 2017-12-30 HISTORY — DX: Unequal limb length (acquired), unspecified site: M21.70

## 2017-12-30 HISTORY — DX: Sleep disorder, unspecified: G47.9

## 2017-12-30 HISTORY — DX: Myalgia of auxiliary muscles, head and neck: M79.12

## 2017-12-30 MED ORDER — AMITRIPTYLINE HCL 10 MG PO TABS: ORAL_TABLET | ORAL | 1 refills | Status: DC

## 2017-12-30 NOTE — Progress Notes (Signed)
EVALEIGH MCCAMY - 57 y.o. female MRN [id number]  Date of birth: [birth year]   Chief complaint: R lateral hip, R neck pain  SUBJECTIVE:    History of present illness: Patient is a 57 year old female who presents for reevaluation for right lateral hip pain, right neck tightness, and abnormal gait.  She was just seen 1 week ago and diagnosed with sternocleidomastoid muscle dysfunction on the left.  She completed her neck stretching exercises however she felt she overstretched and started to have discomfort and tightness on her right side.  Is also having a series of muscular complaints including right gluteal pain, mid back pain, and left-sided knee pain.  She continues to see [name] for physical therapy and adjustments who has been assisting her with her musculoskeletal complaints.  The patient does not appear to be healing to the rate that she would expect.  She denies any new symptoms of numbness or tingling. Denies any acute injuries.  She does state that she has been back to walking 1 mile in the morning and 1 mile in the evening.  She is not playing pickle ball as this exacerbated her symptoms initially.  She does have a history of adhesive capsulitis which has nearly resolved.  She does report that she does not sleep nearly ever.  When she does sleep she sleeps for 2 to 3 hours at a time but feels like she never wakes up well rested.  She has never used any medications to assist with sleep.  She states she is not anxious.  Her husband does have significant anxiety and takes medications for this.  Patient does not wear orthotics.  She has never had corrections performed for her leg length discrepancy.  Denies recent fevers chills or night sweats.  No changes in bowel or bladder habits.   Review of systems:  As stated above   Interval past medical history, surgical history, family history, and social history obtained and are unchanged.   OBJECTIVE:  Physical exam: Vital signs are reviewed. BP  116/80   Ht 5\' 3"  (1.6 m)   Wt 124 lb (56.2 kg)   BMI 21.97 kg/m   Gen.: Alert, oriented, appears stated age, in no apparent distress Respiratory: Normal respirations, able to speak in full sentences Cardiac: Regular rate, distal pulses 2+ Gait: Slight lean with a stumble to the right when walking, her right arm abducts significantly more than her left with her normal walking phase Psych: Anxious appearing, rapid speech at times Musculoskeletal: Section of the right neck demonstrates significant tightness    ASSESSMENT & PLAN: 1. SCM tightness R 2. Leg length discrepancy R longer by 1.5cm 3. Sleep disturbance disorder   Plan: After further evaluation of the patient, I do believe she is suffering from further musculoskeletal complaints.  The concern is that she has alternating tightness of several different muscle groups and is not recovering to the extent that we would expect normal sternocleidomastoid spasm, low back, or quadriceps muscles should heal.  It is possible that she is not achieving REM sleep to properly undergo muscle healing.  The patient does endorse that she does not sleep and when she does it is only for 2 to 3 hours at a time.  We would like to trial her on 10 mg at night of amitriptyline to try and improve sleep.  She will take this nightly and if in 1 week she still is not achieving an adequate amount of sleep or feeling well rested, she will increase  her dosage to 20 mg at night of amitriptyline.  We did counsel on common and severe side effects of the medication.  She is willing to trial this medicine to assist with muscle recovery and sleep.  Furthermore, we inserted a half a centimeter lift in her left shoe and attempt to help correct her leg length discrepancy and other pelvic girdle complaints.  She will continue to wear this while she is doing exercises.  She is now able to return to pickleball.  We will see her tentatively in 4 to 6 weeks for follow-up.  She is to  continue doing her sternocleidomastoid stretches and using heat and anti-inflammatories as needed.  Gustavus MessingAJ Pinney, DO Sports Medicine Fellow [facility]  I observed and examined the patient with the[facility] Fellows and agree with assessment and plan.  Note reviewed and modified by me. Enid BaasKarl Ashantia Amaral, MD

## 2018-01-04 ENCOUNTER — Other Ambulatory Visit: Payer: Self-pay

## 2018-01-04 MED ORDER — CYCLOBENZAPRINE HCL 5 MG PO TABS: 5.0000 mg | ORAL_TABLET | Freq: Every day | ORAL | 1 refills | Status: DC | PRN

## 2018-01-26 ENCOUNTER — Other Ambulatory Visit: Payer: Self-pay | Admitting: Obstetrics & Gynecology

## 2018-01-26 DIAGNOSIS — R928 Other abnormal and inconclusive findings on diagnostic imaging of breast: Secondary | ICD-10-CM

## 2018-01-28 ENCOUNTER — Ambulatory Visit
Admission: RE | Admit: 2018-01-28 | Discharge: 2018-01-28 | Disposition: A | Discharge status: Home / Self Care | Payer: 59 | Source: Ambulatory Visit | Attending: Obstetrics & Gynecology | Admitting: Obstetrics & Gynecology

## 2018-01-28 ENCOUNTER — Ambulatory Visit: Payer: 59

## 2018-01-28 DIAGNOSIS — R928 Other abnormal and inconclusive findings on diagnostic imaging of breast: Secondary | ICD-10-CM

## 2018-02-03 ENCOUNTER — Ambulatory Visit: Payer: 59 | Admitting: Sports Medicine

## 2018-04-22 ENCOUNTER — Encounter (HOSPITAL_COMMUNITY): Payer: Self-pay | Admitting: Emergency Medicine

## 2018-04-22 ENCOUNTER — Emergency Department (HOSPITAL_COMMUNITY)
Admission: EM | Admit: 2018-04-22 | Discharge: 2018-04-22 | Disposition: A | Discharge status: Home / Self Care | Payer: 59 | Attending: Emergency Medicine | Admitting: Emergency Medicine

## 2018-04-22 ENCOUNTER — Other Ambulatory Visit: Payer: Self-pay

## 2018-04-22 DIAGNOSIS — Z79899 Other long term (current) drug therapy: Secondary | ICD-10-CM | POA: Insufficient documentation

## 2018-04-22 DIAGNOSIS — R1013 Epigastric pain: Secondary | ICD-10-CM | POA: Diagnosis present

## 2018-04-22 LAB — CBC WITH DIFFERENTIAL/PLATELET
Abs Immature Granulocytes: 0.02 10*3/uL (ref 0.00–0.07)
Basophils Absolute: 0.1 10*3/uL (ref 0.0–0.1)
Basophils Relative: 1 %
Eosinophils Absolute: 0.1 10*3/uL (ref 0.0–0.5)
Eosinophils Relative: 1 %
HCT: 43.6 % (ref 36.0–46.0)
Hemoglobin: 14.3 g/dL (ref 12.0–15.0)
Immature Granulocytes: 0 %
Lymphocytes Relative: 29 %
Lymphs Abs: 2 10*3/uL (ref 0.7–4.0)
MCH: 29 pg (ref 26.0–34.0)
MCHC: 32.8 g/dL (ref 30.0–36.0)
MCV: 88.4 fL (ref 80.0–100.0)
Monocytes Absolute: 0.5 10*3/uL (ref 0.1–1.0)
Monocytes Relative: 7 %
Neutro Abs: 4.3 10*3/uL (ref 1.7–7.7)
Neutrophils Relative %: 62 %
Platelets: 220 10*3/uL (ref 150–400)
RBC: 4.93 MIL/uL (ref 3.87–5.11)
RDW: 12.5 % (ref 11.5–15.5)
WBC: 6.9 10*3/uL (ref 4.0–10.5)
nRBC: 0 % (ref 0.0–0.2)

## 2018-04-22 LAB — I-STAT TROPONIN, ED: Troponin i, poc: 0 ng/mL (ref 0.00–0.08)

## 2018-04-22 LAB — COMPREHENSIVE METABOLIC PANEL
ALT: 16 U/L (ref 0–44)
AST: 23 U/L (ref 15–41)
Albumin: 4.5 g/dL (ref 3.5–5.0)
Alkaline Phosphatase: 71 U/L (ref 38–126)
Anion gap: 11 (ref 5–15)
BUN: 13 mg/dL (ref 6–20)
CO2: 22 mmol/L (ref 22–32)
Calcium: 9.6 mg/dL (ref 8.9–10.3)
Chloride: 102 mmol/L (ref 98–111)
Creatinine, Ser: 0.84 mg/dL (ref 0.44–1.00)
GFR calc Af Amer: >60 mL/min (ref >60)
GFR calc non Af Amer: >60 mL/min (ref >60)
GLUCOSE: 125 mg/dL — AB (ref 70–99)
Potassium: 3.8 mmol/L (ref 3.5–5.1)
SODIUM: 135 mmol/L (ref 135–145)
Total Bilirubin: 0.7 mg/dL (ref 0.3–1.2)
Total Protein: 7.8 g/dL (ref 6.5–8.1)

## 2018-04-22 LAB — LIPASE, BLOOD: Lipase: 28 U/L (ref 11–51)

## 2018-04-22 MED ORDER — PANTOPRAZOLE SODIUM 40 MG PO TBEC: 40.0000 mg | DELAYED_RELEASE_TABLET | Freq: Every day | ORAL | 0 refills | Status: DC | PRN

## 2018-04-22 MED ORDER — PANTOPRAZOLE SODIUM 40 MG PO TBEC
40.0000 mg | DELAYED_RELEASE_TABLET | Freq: Once | ORAL | Status: AC
  Administered 2018-04-22: 40 mg via ORAL
  Filled 2018-04-22: qty 1

## 2018-04-22 MED ORDER — ONDANSETRON 4 MG PO TBDP: 4.0000 mg | ORAL_TABLET | Freq: Four times a day (QID) | ORAL | 0 refills | Status: DC | PRN

## 2018-04-22 MED ORDER — ONDANSETRON 4 MG PO TBDP
4.0000 mg | ORAL_TABLET | Freq: Once | ORAL | Status: AC
  Administered 2018-04-22: 4 mg via ORAL
  Filled 2018-04-22: qty 1

## 2018-04-22 MED ORDER — ALUM & MAG HYDROXIDE-SIMETH 200-200-20 MG/5ML PO SUSP
30.0000 mL | Freq: Once | ORAL | Status: AC
  Administered 2018-04-22: 30 mL via ORAL
  Filled 2018-04-22: qty 30

## 2018-04-22 MED ORDER — LIDOCAINE VISCOUS HCL 2 % MT SOLN
15.0000 mL | Freq: Once | OROMUCOSAL | Status: AC
  Administered 2018-04-22: 15 mL via ORAL
  Filled 2018-04-22: qty 15

## 2018-04-22 NOTE — ED Provider Notes (Signed)
TIME SEEN: 2:35 AM  CHIEF COMPLAINT: Abdominal pain  HPI: Patient is a 57 year old female who presents to the emergency department with epigastric abdominal pain that started tonight after eating dinner.  Worsened over the past few hours.  States she felt bloated, full and was burping.  On the way here she states pain improved and has almost completely resolved.  Pain did go into her just she thought initially this was like indigestion.  No shortness of breath.  No fever.  No vomiting.  No diarrhea.  Did feel nauseated today.  Has had these "episodes" intermittently previously.  She is not aware of any aggravating or alleviating factors.  States she just had a right upper quadrant ultrasound June 2019 which was normal.  States the pain she is having today feels similar to pain she has been having over the past several years.  She is previously had a HIDA scan in 2017 which was normal.  States she has previously had an endoscopy within the past by Dr. Evette Cristal which was also normal.  10/26/17 abdominal US:  IMPRESSION: No findings to explain epigastric pain.   Electronically Signed by: Eddie Candle  Result Narrative  RIGHT UPPER QUADRANT ULTRASOUND  TECHNIQUE: Grayscale and limited color Doppler ultrasound of the right upper quadrant was performed.  COMPARISON: None.   INDICATION: Epigastric pain  FINDINGS:  PANCREAS:  - Visualized portions of the pancreas are unremarkable.   VASCULATURE: - No abdominal aortic aneurysm.  - Visualized IVC is patent. - Portal vein is patent with normal flow direction.  HEPATOBILIARY: - Liver: Normal contour and echogenicity. - Gallbladder: No stones, wall thickening, or pericholecystic fluid. Questionable 2 mm polyp within the gallbladder wall. - CBD: Normal in size measuring 0.2 cm. - No intrahepatic biliary ductal dilatation.  RIGHT KIDNEY: - The right kidney is normal in size, measuring 10.7 cm. - No hydronephrosis. - No suspicious  masses. - Normal renal echotexture.  MISC: - N./A.     ROS: See HPI Constitutional: no fever  Eyes: no drainage  ENT: no runny nose   Cardiovascular:  no chest pain  Resp: no SOB  GI: no vomiting GU: no dysuria Integumentary: no rash  Allergy: no hives  Musculoskeletal: no leg swelling  Neurological: no slurred speech ROS otherwise negative  PAST MEDICAL HISTORY/PAST SURGICAL HISTORY:  Past Medical History:  Diagnosis Date  . Headache   . Vertigo     MEDICATIONS:  Prior to Admission medications   Medication Sig Start Date End Date Taking? Authorizing Provider  amitriptyline (ELAVIL) 10 MG tablet Take 1-2 tabs at night 12/30/17   Enid Baas, MD  cyclobenzaprine (FLEXERIL) 5 MG tablet Take 1 tablet (5 mg total) by mouth daily as needed for muscle spasms. Can take up to 3 times daily as needed. 01/04/18   Enid Baas, MD    ALLERGIES:  Allergies  Allergen Reactions  . Prednisone     Feels like skin crawls    SOCIAL HISTORY:  Social History   Tobacco Use  . Smoking status: Never Smoker  . Smokeless tobacco: Never Used  Substance Use Topics  . Alcohol use: Yes    Comment: occasionally    FAMILY HISTORY: Family History  Problem Relation Age of Onset  . Hypertension Mother   . Hypertension Father   . Cancer Father   . Diabetes Father   . Coronary artery disease Father        History of CABG  . Parkinsonism Paternal Grandfather   .  High Cholesterol Brother 42    EXAM: BP 134/68   Pulse 88   Temp 98.8 F (37.1 C) (Oral)   Resp 12   SpO2 100%  CONSTITUTIONAL: Alert and oriented and responds appropriately to questions. Well-appearing; well-nourished HEAD: Normocephalic EYES: Conjunctivae clear, pupils appear equal, EOMI ENT: normal nose; moist mucous membranes NECK: Supple, no meningismus, no nuchal rigidity, no LAD  CARD: RRR; S1 and S2 appreciated; no murmurs, no clicks, no rubs, no gallops RESP: Normal chest excursion without splinting or  tachypnea; breath sounds clear and equal bilaterally; no wheezes, no rhonchi, no rales, no hypoxia or respiratory distress, speaking full sentences ABD/GI: Normal bowel sounds; non-distended; soft, mildly tender in the epigastric region, negative Murphy sign, no rebound, no guarding, no peritoneal signs, no hepatosplenomegaly BACK:  The back appears normal and is non-tender to palpation, there is no CVA tenderness EXT: Normal ROM in all joints; non-tender to palpation; no edema; normal capillary refill; no cyanosis, no calf tenderness or swelling    SKIN: Normal color for age and race; warm; no rash NEURO: Moves all extremities equally PSYCH: The patient's mood and manner are appropriate. Grooming and personal hygiene are appropriate.  MEDICAL DECISION MAKING: Patient here with upper abdominal pain.  Differential includes gastritis, GERD, peptic ulcer disease, less likely cholelithiasis or cholecystitis, pancreatitis.  Will obtain abdominal labs.  EKG shows no ischemic changes.  Low suspicion for ACS or PE.  We will treat symptomatically with Mylanta, lidocaine, Protonix, Zofran.  ED PROGRESS: Patient reports feeling better.  Labs unremarkable including negative troponin.  Anticipate discharge home.  Recommended close follow-up with Dr. Evette CristalGanem for continued symptoms.  Will discharge with prescriptions of Protonix, Zofran to take as needed and recommended over-the-counter Mylanta.  Patient and husband verbalized understanding and are comfortable with this plan.   At this time, I do not feel there is any life-threatening condition present. I have reviewed and discussed all results (EKG, imaging, lab, urine as appropriate) and exam findings with patient/family. I have reviewed nursing notes and appropriate previous records.  I feel the patient is safe to be discharged home without further emergent workup and can continue workup as an outpatient as needed. Discussed usual and customary return precautions.  Patient/family verbalize understanding and are comfortable with this plan.  Outpatient follow-up has been provided if needed. All questions have been answered.      EKG Interpretation  Date/Time:  Friday April 22 2018 02:35:22 EST Ventricular Rate:  77 PR Interval:    QRS Duration: 95 QT Interval:  391 QTC Calculation: 443 R Axis:   25 Text Interpretation:  Sinus rhythm Borderline short PR interval RSR' in V1 or V2, probably normal variant No old tracing to compare Confirmed by Ward, Baxter HireKristen [phone]([id number]) on 04/22/2018 2:39:13 AM         Ward, Layla MawKristen N, DO 04/22/18 [id number]

## 2018-04-22 NOTE — ED Notes (Signed)
MD at bedside. 

## 2018-04-22 NOTE — ED Triage Notes (Addendum)
Pt reports mid upper abdominal pain starting tonight shortly after eating. Pt reports some nausea, no vomiting, and burping. Pt also reports passing gas and feeling relief of abdominal pain. Pt denies diarrhea. Pt reports hx of reflux.

## 2018-04-22 NOTE — Discharge Instructions (Addendum)
You may use over-the-counter Mylanta or Maalox whenever you have abdominal pain.  Your labs, EKG today were normal.  Please follow-up with your gastroenterologist for further recommendations.

## 2018-07-21 NOTE — Progress Notes (Signed)
NEUROLOGY FOLLOW UP OFFICE NOTE  Dana [name] [id number]  HISTORY OF PRESENT ILLNESS: Dana [name] is a 58 year old left-handed woman who follows up for migraine with aura.  UPDATE: She has had maybe 2 or 3 migraine attacks over the past year.  They are mild-moderate intensity and last a couple of hours.  They are not severe enough to take sumatriptan.  She hasn't tried sumatriptan because her sister doesn't like it and made her feel uneasy about it.  She hasn't had any vertigo attacks over the past year.  Over the summer she had a left frozen shoulder which has caused left jaw pain.  It is improved  Last year, she reported numbness and tingling in the right upper extremity.  She particularly notices it in bed.  She notes in her fingers and sometimes radiates up the forearm to the elbow.  She denies radicular pain down the arm.  She denies weakness.  She needs to shake it out and it resolves after a couple of minutes.  She has mild symptoms in the left arm and has been diagnosed with carpal tunnel syndrome in the left hand several years ago.  She also occasionally has tight sensation in the left posterior of aspiect of her neck, radiating up to the suboccipital region and into shoulder.  No associated numbness, shooting pain or weakness into the arm.  She was treated by a physical therapist who worked on her neck.  She was prescribed wrist splints.  They are effective.  HISTORY: In 2006, she had a viral illness, in which she had syncope and developed diffuse weakness and paresthesias.  Neurologic workup and physical exam was normal.  I  BPPV:  She has had approximately 3 episodes of vertigo, described as spinning.  In 2012, she woke up one morning with spinning sensation.  It would last a few seconds with head movement.  She also had nasal congestion.  She was treated with antibiotics, which were ineffective.  CT of her sinuses and MRI of brain with and without contrast from April 2012  were unremarkable.  She had a positive Dix-Hallpike and was diagnosed with BPPV.  She had another episode about a year later, which was severe and lasted a few seconds but afterwards she was nauseous and vomited.  MRI and MRA of head from 04/28/12 was unremarkable.  She had one other episode a few months ago, which quickly resolved.  She has been evaluated by ENT and has deviated septum to the right.  II  MIGRAINES:  She started having migraines in young adulthood.  They usually occur at the back of her head.  Typically they are mild but can be a severe pounding ache.  When severe, they are accompanied by nausea and photophobia.  They are preceded by visual aura of zigzag lines.  She reports a prodrome of feeling strange.  They occur for the day but she does not take any pain reliever for it.  They occur once in a while, such as every 2 to several months.    There are no triggers.  Laying down to rest helps relieve it.  III  INTERMITTENT LEFT FACIAL TINGLING:  On a couple of occasions, she reports brief episodes of left facial tingling.  It occurred once in December 2016.  It was thought to be due to her neck.  It occurred again about 2 months ago.  It only lasts a day.  She denied neck pain, facial droop, headache or focal numbness  and weakness.  They are not associated with her migraines.  PAST MEDICAL HISTORY: Past Medical History:  Diagnosis Date  . Headache   . Vertigo     MEDICATIONS: Current Outpatient Medications on File Prior to Visit  Medication Sig Dispense Refill  . amitriptyline (ELAVIL) 10 MG tablet Take 1-2 tabs at night (Patient not taking: Reported on 04/22/2018) 60 tablet 1  . cyclobenzaprine (FLEXERIL) 5 MG tablet Take 1 tablet (5 mg total) by mouth daily as needed for muscle spasms. Can take up to 3 times daily as needed. (Patient not taking: Reported on 04/22/2018) 30 tablet 1  . ondansetron (ZOFRAN ODT) 4 MG disintegrating tablet Take 1 tablet (4 mg total) by mouth every 6  (six) hours as needed. 20 tablet 0  . pantoprazole (PROTONIX) 40 MG tablet Take 1 tablet (40 mg total) by mouth daily as needed (abdominal pain, bloating, reflux). 30 tablet 0   No current facility-administered medications on file prior to visit.     ALLERGIES: Allergies  Allergen Reactions  . Prednisone     Feels like skin crawls    FAMILY HISTORY: Family History  Problem Relation Age of Onset  . Hypertension Mother   . Hypertension Father   . Cancer Father   . Diabetes Father   . Coronary artery disease Father        History of CABG  . Parkinsonism Paternal Grandfather   . High Cholesterol Brother 42   SOCIAL HISTORY: Social History   Socioeconomic History  . Marital status: Married    Spouse name: Not on file  . Number of children: Not on file  . Years of education: Not on file  . Highest education level: Not on file  Occupational History  . Not on file  Social Needs  . Financial resource strain: Not on file  . Food insecurity:    Worry: Not on file    Inability: Not on file  . Transportation needs:    Medical: Not on file    Non-medical: Not on file  Tobacco Use  . Smoking status: Never Smoker  . Smokeless tobacco: Never Used  Substance and Sexual Activity  . Alcohol use: Yes    Comment: occasionally  . Drug use: Never  . Sexual activity: Not on file  Lifestyle  . Physical activity:    Days per week: Not on file    Minutes per session: Not on file  . Stress: Not on file  Relationships  . Social connections:    Talks on phone: Not on file    Gets together: Not on file    Attends religious service: Not on file    Active member of club or organization: Not on file    Attends meetings of clubs or organizations: Not on file    Relationship status: Not on file  . Intimate partner violence:    Fear of current or ex partner: Not on file    Emotionally abused: Not on file    Physically abused: Not on file    Forced sexual activity: Not on file  Other  Topics Concern  . Not on file  Social History Narrative  . Not on file    REVIEW OF SYSTEMS: Constitutional: No fevers, chills, or sweats, no generalized fatigue, change in appetite Eyes: No visual changes, double vision, eye pain Ear, nose and throat: No hearing loss, ear pain, nasal congestion, sore throat Cardiovascular: No chest pain, palpitations Respiratory:  No shortness of breath at rest  or with exertion, wheezes GastrointestinaI: No nausea, vomiting, diarrhea, abdominal pain, fecal incontinence Genitourinary:  No dysuria, urinary retention or frequency Musculoskeletal:  No neck pain, back pain Integumentary: No rash, pruritus, skin lesions Neurological: as above Psychiatric: No depression, insomnia, anxiety Endocrine: No palpitations, fatigue, diaphoresis, mood swings, change in appetite, change in weight, increased thirst Hematologic/Lymphatic:  No purpura, petechiae. Allergic/Immunologic: no itchy/runny eyes, nasal congestion, recent allergic reactions, rashes  PHYSICAL EXAM: Blood pressure 110/82, pulse 85, height  (1.6 m), weight 129 lb (58.5 kg), SpO2 99 %. General: No acute distress.  Patient appears well-groomed.  Head:  Normocephalic/atraumatic Eyes:  Fundi examined but not visualized Neck: supple, no paraspinal tenderness, full range of motion Heart:  Regular rate and rhythm Lungs:  Clear to auscultation bilaterally Back: No paraspinal tenderness Neurological Exam: alert and oriented to person, place, and time. Attention span and concentration intact, recent and remote memory intact, fund of knowledge intact.  Speech fluent and not dysarthric, language intact.  CN II-XII intact. Bulk and tone normal, muscle strength 5/5 throughout.  Sensation to light touch, temperature and vibration intact.  Deep tendon reflexes 2+ throughout, toes downgoing.  Finger to nose and heel to shin testing intact.  Gait normal, Romberg negative.  IMPRESSION: 1.  Migraine with aura,  without status migrainosus, not intractable 2.  BPPV, stable 3.  Carpal tunnel syndrome, stable  PLAN: 1.  For abortive therapy, tylenol.  Sumatriptan second line 2.  Limit use of pain relievers to no more than 2 days out of week to prevent risk of rebound or medication-overuse headache. 3.  Keep headache diary 4.  Exercise, hydration, caffeine cessation, sleep hygiene, monitor for and avoid triggers 5.  Consider:  magnesium citrate  daily, riboflavin  daily, and coenzyme Q10  three times daily 6. Wrist splints  7. Follow up in one year or as needed  Shon Millet, DO  CC: Tracey Harries, MD

## 2018-07-25 ENCOUNTER — Encounter: Payer: Self-pay | Admitting: Neurology

## 2018-07-25 ENCOUNTER — Ambulatory Visit (INDEPENDENT_AMBULATORY_CARE_PROVIDER_SITE_OTHER): Payer: 59 | Admitting: Neurology

## 2018-07-25 VITALS — BP 110/82 | HR 85 | Ht 63.0 in | Wt 129.0 lb

## 2018-07-25 DIAGNOSIS — G5603 Carpal tunnel syndrome, bilateral upper limbs: Secondary | ICD-10-CM

## 2018-07-25 DIAGNOSIS — H8110 Benign paroxysmal vertigo, unspecified ear: Secondary | ICD-10-CM | POA: Diagnosis not present

## 2018-07-25 DIAGNOSIS — G43109 Migraine with aura, not intractable, without status migrainosus: Secondary | ICD-10-CM | POA: Diagnosis not present

## 2018-08-01 ENCOUNTER — Ambulatory Visit: Payer: 59 | Admitting: Neurology

## 2018-09-19 ENCOUNTER — Encounter (HOSPITAL_COMMUNITY): Payer: Self-pay

## 2018-09-19 ENCOUNTER — Emergency Department (HOSPITAL_COMMUNITY)
Admission: EM | Admit: 2018-09-19 | Discharge: 2018-09-19 | Disposition: A | Discharge status: Home / Self Care | Payer: BLUE CROSS/BLUE SHIELD | Attending: Emergency Medicine | Admitting: Emergency Medicine

## 2018-09-19 ENCOUNTER — Emergency Department (HOSPITAL_COMMUNITY): Payer: BLUE CROSS/BLUE SHIELD

## 2018-09-19 ENCOUNTER — Other Ambulatory Visit: Payer: Self-pay

## 2018-09-19 DIAGNOSIS — R1013 Epigastric pain: Secondary | ICD-10-CM | POA: Insufficient documentation

## 2018-09-19 DIAGNOSIS — R109 Unspecified abdominal pain: Secondary | ICD-10-CM | POA: Diagnosis not present

## 2018-09-19 DIAGNOSIS — R1011 Right upper quadrant pain: Secondary | ICD-10-CM | POA: Insufficient documentation

## 2018-09-19 DIAGNOSIS — Z87891 Personal history of nicotine dependence: Secondary | ICD-10-CM | POA: Insufficient documentation

## 2018-09-19 DIAGNOSIS — R079 Chest pain, unspecified: Secondary | ICD-10-CM | POA: Diagnosis not present

## 2018-09-19 DIAGNOSIS — K828 Other specified diseases of gallbladder: Secondary | ICD-10-CM | POA: Diagnosis not present

## 2018-09-19 LAB — COMPREHENSIVE METABOLIC PANEL
ALT: 17 U/L (ref 0–44)
AST: 22 U/L (ref 15–41)
Albumin: 4.3 g/dL (ref 3.5–5.0)
Alkaline Phosphatase: 63 U/L (ref 38–126)
Anion gap: 13 (ref 5–15)
BUN: 10 mg/dL (ref 6–20)
CO2: 23 mmol/L (ref 22–32)
Calcium: 9.9 mg/dL (ref 8.9–10.3)
Chloride: 101 mmol/L (ref 98–111)
Creatinine, Ser: 0.94 mg/dL (ref 0.44–1.00)
GFR calc Af Amer: >60 mL/min (ref >60)
GFR calc non Af Amer: >60 mL/min (ref >60)
Glucose, Bld: 136 mg/dL — ABNORMAL HIGH (ref 70–99)
Potassium: 3.6 mmol/L (ref 3.5–5.1)
Sodium: 137 mmol/L (ref 135–145)
Total Bilirubin: 0.8 mg/dL (ref 0.3–1.2)
Total Protein: 7.5 g/dL (ref 6.5–8.1)

## 2018-09-19 LAB — CBC
HCT: 41.2 % (ref 36.0–46.0)
Hemoglobin: 14.8 g/dL (ref 12.0–15.0)
MCH: 31 pg (ref 26.0–34.0)
MCHC: 35.9 g/dL (ref 30.0–36.0)
MCV: 86.4 fL (ref 80.0–100.0)
Platelets: 210 10*3/uL (ref 150–400)
RBC: 4.77 MIL/uL (ref 3.87–5.11)
RDW: 12.5 % (ref 11.5–15.5)
WBC: 7.3 10*3/uL (ref 4.0–10.5)
nRBC: 0 % (ref 0.0–0.2)

## 2018-09-19 LAB — LIPASE, BLOOD: Lipase: 26 U/L (ref 11–51)

## 2018-09-19 LAB — TROPONIN I: Troponin I: <0.03 ng/mL (ref <0.03)

## 2018-09-19 MED ORDER — PANTOPRAZOLE SODIUM 40 MG IV SOLR
40.0000 mg | Freq: Once | INTRAVENOUS | Status: AC
  Administered 2018-09-19: 04:00:00 40 mg via INTRAVENOUS
  Filled 2018-09-19: qty 40

## 2018-09-19 MED ORDER — METOCLOPRAMIDE HCL 5 MG/ML IJ SOLN
10.0000 mg | INTRAMUSCULAR | Status: AC
  Administered 2018-09-19: 10 mg via INTRAVENOUS
  Filled 2018-09-19: qty 2

## 2018-09-19 MED ORDER — LIDOCAINE VISCOUS HCL 2 % MT SOLN
15.0000 mL | Freq: Once | OROMUCOSAL | Status: AC
  Administered 2018-09-19: 15 mL via ORAL
  Filled 2018-09-19: qty 15

## 2018-09-19 MED ORDER — FENTANYL CITRATE (PF) 100 MCG/2ML IJ SOLN
50.0000 ug | Freq: Once | INTRAMUSCULAR | Status: DC
  Filled 2018-09-19: qty 2

## 2018-09-19 MED ORDER — DICYCLOMINE HCL 20 MG PO TABS: 20.0000 mg | ORAL_TABLET | Freq: Two times a day (BID) | ORAL | 0 refills | Status: DC | PRN

## 2018-09-19 MED ORDER — DICYCLOMINE HCL 10 MG/ML IM SOLN
20.0000 mg | Freq: Once | INTRAMUSCULAR | Status: DC
  Filled 2018-09-19: qty 2

## 2018-09-19 MED ORDER — ALUM & MAG HYDROXIDE-SIMETH 200-200-20 MG/5ML PO SUSP
30.0000 mL | Freq: Once | ORAL | Status: AC
  Administered 2018-09-19: 04:00:00 30 mL via ORAL
  Filled 2018-09-19: qty 30

## 2018-09-19 NOTE — Discharge Instructions (Signed)
Your work-up in the emergency department has been reassuring.  We advise follow-up with your primary care doctor and your GI physician regarding your persistent, ongoing discomfort.  You may take Bentyl as needed for persistent discomfort.  Discuss this medication with your doctor before taking, if desired. Return to the ED for new or concerning symptoms.

## 2018-09-19 NOTE — ED Triage Notes (Signed)
Pt from home w/ a c/o epigastric and chest pain. She is only having epigastric pain at this time. The pain has been intermittent for two days. It's sharp and the pt believes it to be indigestion but she has not had anything to eat for two days. Additional complaints of nausea and diarrhea. No vomiting. She had several episodes of diarrhea yesterday morning. Temp of 99.0 yesterday. No cough or SOB.

## 2018-09-19 NOTE — ED Notes (Signed)
Patient verbalizes understanding of discharge instructions. Opportunity for questioning and answers were provided. Armband removed by staff, pt discharged from ED.  

## 2018-09-19 NOTE — ED Provider Notes (Addendum)
[facility]   CSN: [id number] Arrival date & time: 09/19/18  0308    History   Chief Complaint Chief Complaint  Patient presents with  . Abdominal Pain  . Chest Pain    HPI Dana [name] is a 58 y.o. female.     58 year old female with a history of vertigo presents to the emergency department for complaints of abdominal pain.  She does have a history of chronic abdominal pain for which she is followed by gastroenterology (MD Evette CristalGanem).  Notes worsening symptoms x2 days.  They have been waxing and waning.  Had some looser stool at onset of worsening discomfort.  Notes some nausea tonight.  She has had increased belching without symptomatic improvement.  Notes the pain to be primarily in her epigastrium, though it has mildly radiated to her right mid abdomen.  She denies taking any medications for symptoms.  No fevers, vomiting, cough, shortness of breath, chest pain.  No history of abdominal surgeries.  History of normal HIDA scan in 2017 with normal right upper quadrant ultrasound in 2016.  The history is provided by the patient. No language interpreter was used.  Abdominal Pain  Associated symptoms: chest pain   Chest Pain  Associated symptoms: abdominal pain     Past Medical History:  Diagnosis Date  . Headache   . Vertigo     Patient Active Problem List   Diagnosis Date Noted  . Sternocleidomastoid muscle tenderness 12/30/2017  . Leg length discrepancy 12/30/2017  . Disturbance in sleep behavior 12/30/2017  . Neck pain 12/23/2017  . Adhesive capsulitis of left shoulder 12/23/2017  . Chest pain, precordial 09/30/2017  . Pre-op testing 09/30/2017  . DOE (dyspnea on exertion) 09/29/2017  . Family history of early CAD 09/29/2017  . Pain of left heel 06/02/2016  . BPPV (benign paroxysmal positional vertigo) 07/31/2015  . Migraine with aura and without status migrainosus, not intractable 07/31/2015  . Abnormality of  gait 07/18/2015  . Pain in joint, ankle and foot 06/20/2013  . Menopause 11/08/2011  . Nasal septal deviation 11/08/2011  . Insomnia 11/08/2011  . AR (allergic rhinitis) 11/08/2011    History reviewed. No pertinent surgical history.   OB History   No obstetric history on file.      Home Medications    Prior to Admission medications   Medication Sig Start Date End Date Taking? Authorizing Provider  dicyclomine (BENTYL) 20 MG tablet Take 1 tablet (20 mg total) by mouth every 12 (twelve) hours as needed (for abdominal pain/cramping). 09/19/18   Antony MaduraHumes, Suzana Sohail, PA-C  ondansetron (ZOFRAN ODT) 4 MG disintegrating tablet Take 1 tablet (4 mg total) by mouth every 6 (six) hours as needed. 04/22/18   Ward, Layla MawKristen N, DO  pantoprazole (PROTONIX) 40 MG tablet Take 1 tablet (40 mg total) by mouth daily as needed (abdominal pain, bloating, reflux). 04/22/18   Ward, Layla MawKristen N, DO    Family History Family History  Problem Relation Age of Onset  . Hypertension Mother   . Hypertension Father   . Cancer Father   . Diabetes Father   . Coronary artery disease Father        History of CABG  . Parkinsonism Paternal Grandfather   . High Cholesterol Brother 2042    Social History Social History   Tobacco Use  . Smoking status: Former Games developermoker  . Smokeless tobacco: Never Used  . Tobacco comment: in highschool  Substance Use Topics  . Alcohol use:  Yes    Comment: occasionally  . Drug use: Never     Allergies   Prednisone   Review of Systems Review of Systems  Cardiovascular: Positive for chest pain.  Gastrointestinal: Positive for abdominal pain.  Ten systems reviewed and are negative for acute change, except as noted in the HPI.    Physical Exam Updated Vital Signs BP (!) 121/94   Pulse 85   Temp 98.4 F (36.9 C) (Oral)   Resp 12   Ht  (1.6 m)   Wt 57.6 kg   SpO2 99%   BMI 22.50 kg/m   Physical Exam Vitals signs and nursing note reviewed.  Constitutional:       General: She is not in acute distress.    Appearance: She is well-developed. She is not diaphoretic.     Comments: Nontoxic appearing, pleasant.  HENT:     Head: Normocephalic and atraumatic.  Eyes:     General: No scleral icterus.    Conjunctiva/sclera: Conjunctivae normal.  Neck:     Musculoskeletal: Normal range of motion.  Cardiovascular:     Rate and Rhythm: Normal rate and regular rhythm.     Pulses: Normal pulses.  Pulmonary:     Effort: Pulmonary effort is normal. No respiratory distress.     Breath sounds: No stridor.     Comments: Respirations even and unlabored Abdominal:     Palpations: There is no mass.     Tenderness: There is abdominal tenderness. There is guarding (voluntary, epigastrium/RUQ).     Hernia: No hernia is present.     Comments: Abdomen soft, nondistended. Focal TTP in the epigastrium and RUQ, though mild tenderness is noted throughout. No peritoneal signs.  Musculoskeletal: Normal range of motion.  Skin:    General: Skin is warm and dry.     Coloration: Skin is not pale.     Findings: No erythema or rash.  Neurological:     General: No focal deficit present.     Mental Status: She is alert and oriented to person, place, and time.     Coordination: Coordination normal.  Psychiatric:        Behavior: Behavior normal.      ED Treatments / Results  Labs (all labs ordered are listed, but only abnormal results are displayed) Labs Reviewed  COMPREHENSIVE METABOLIC PANEL - Abnormal; Notable for the following components:      Result Value   Glucose, Bld 136 (*)    All other components within normal limits  CBC  LIPASE, BLOOD  TROPONIN I    EKG EKG Interpretation  Date/Time:  Monday September 19 2018 03:18:40 EDT Ventricular Rate:  89 PR Interval:    QRS Duration: 94 QT Interval:  370 QTC Calculation: 451 R Axis:   41 Text Interpretation:  Sinus rhythm Minimal ST depression, inferior leads No significant change since last tracing Confirmed by  Zadie Rhine ([id number]) on 09/19/2018 3:22:20 AM   Radiology US Abdomen Limited  Result Date: 09/19/2018 CLINICAL DATA:  Intermittent right upper quadrant abdominal pain for 2 days. EXAM: ULTRASOUND ABDOMEN LIMITED RIGHT UPPER QUADRANT COMPARISON:  Right upper quadrant ultrasound 02/12/2015. Pedicle biliary study 10/05/2015 FINDINGS: Gallbladder: L bladder is mildly distended. There is no wall thickening. No stone or sludge is present. There is no sonographic Murphy sign. Common bile duct: Diameter: 0.1 mm, within normal limits. Liver: No focal lesion identified. Within normal limits in parenchymal echogenicity. Portal vein is patent on color Doppler imaging with normal direction of  blood flow towards the liver. IMPRESSION: Negative right upper quadrant ultrasound. Electronically Signed   By: Marin Roberts M.D.   On: 09/19/2018 05:26    Procedures Procedures (including critical care time)  Medications Ordered in ED Medications  fentaNYL (SUBLIMAZE) injection 50 mcg (0 mcg Intravenous Hold 09/19/18 0517)  dicyclomine (BENTYL) injection 20 mg (0 mg Intramuscular Hold 09/19/18 0517)  alum & mag hydroxide-simeth (MAALOX/MYLANTA) 200-200-20 MG/5ML suspension 30 mL (30 mLs Oral Given 09/19/18 0347)    And  lidocaine (XYLOCAINE) 2 % viscous mouth solution 15 mL (15 mLs Oral Given 09/19/18 0347)  pantoprazole (PROTONIX) injection 40 mg (40 mg Intravenous Given 09/19/18 0354)  metoCLOPramide (REGLAN) injection 10 mg (10 mg Intravenous Given 09/19/18 0352)    4:35 AM Persistent discomfort on recheck. Will add Fentanyl and Bentyl. Given last [name] in 2016, will obtain RUQ [name] to assess for change to gallbladder.  5:29 AM [name] without evidence of wall thickening, fluid, gallstones. Normal CBD.  5:47 AM The patient received neither Bentyl nor fentanyl, but reports improvement to her discomfort.  She felt increasingly gassy and bloated and was able to pass some flatus with improvement to her pain.   Conveyed results of normal ultrasound to which patient verbalizes understanding.  She expresses comfort with discharge with plan to follow-up with her primary doctor and GI physician.   Initial Impression / Assessment and Plan / ED Course  I have reviewed the triage vital signs and the nursing notes.  Pertinent labs & imaging results that were available during my care of the patient were reviewed by me and considered in my medical decision making (see chart for details).        58 year old female presents to the emergency department for evaluation of epigastric abdominal pain with nausea.  Symptoms have been fairly constant over the past 2 days; waxing and waning in severity.  She notes a history of similar chronic abdominal discomfort which is intermittent and has been present for years.  Usually presents to the emergency department when this pain "flares up".  Noted to have focal tenderness in her epigastrium and right upper quadrant.  No guarding.  Her laboratory evaluation today is reassuring without leukocytosis or electrolyte derangements.  She has a preserved liver and kidney function.  Normal lipase.  The patient did undergo right upper quadrant ultrasound given persistent discomfort.  This is normal, stable compared to 2016.  She has had symptomatic improvement following the GI cocktail, Reglan, Protonix.  Patient to be discharged with a course of Bentyl to take as needed for persistent discomfort.  Have advised that she avoid coffee as well as spicy foods and citrus fruits.  Encouraged follow-up with her primary care doctor as well as her gastroenterologist.  Return precautions discussed and provided. Patient discharged in stable condition with no unaddressed concerns.  Vitals:   09/19/18 0345 09/19/18 0530 09/19/18 0545 09/19/18 0600  BP:  111/77 109/84 105/79  Pulse: 85 76 83 68  Resp: 12 17 (!) 22 17  Temp:      TempSrc:      SpO2: 99% 97% 99% 99%  Weight:      Height:         Final Clinical Impressions(s) / ED Diagnoses   Final diagnoses:  Epigastric pain    ED Discharge Orders         Ordered    dicyclomine (BENTYL) 20 MG tablet  Every 12 hours PRN     09/19/18 0546  Antony Madura, PA-C 09/19/18 0603    Antony Madura, PA-C 09/19/18 Ulis Rias    Zadie Rhine, MD 09/19/18 [phone]

## 2018-09-19 NOTE — ED Notes (Signed)
Pt reports that the pain has improved "only a little" since administration of meds.

## 2018-10-18 IMAGING — MG DIGITAL DIAGNOSTIC UNILATERAL LEFT MAMMOGRAM WITH TOMO AND CAD
4 series · 4 of 12 positions shown · non-contrast
Comparison: Screening mammogram January 25, 2018

CLINICAL DATA: 57-year-old patient recalled from recent screening
mammogram for possible mass identified only on the CC view of the
left breast.

EXAM:
DIGITAL DIAGNOSTIC UNILATERAL LEFT MAMMOGRAM WITH CAD AND TOMO

[L ML synth-2D]
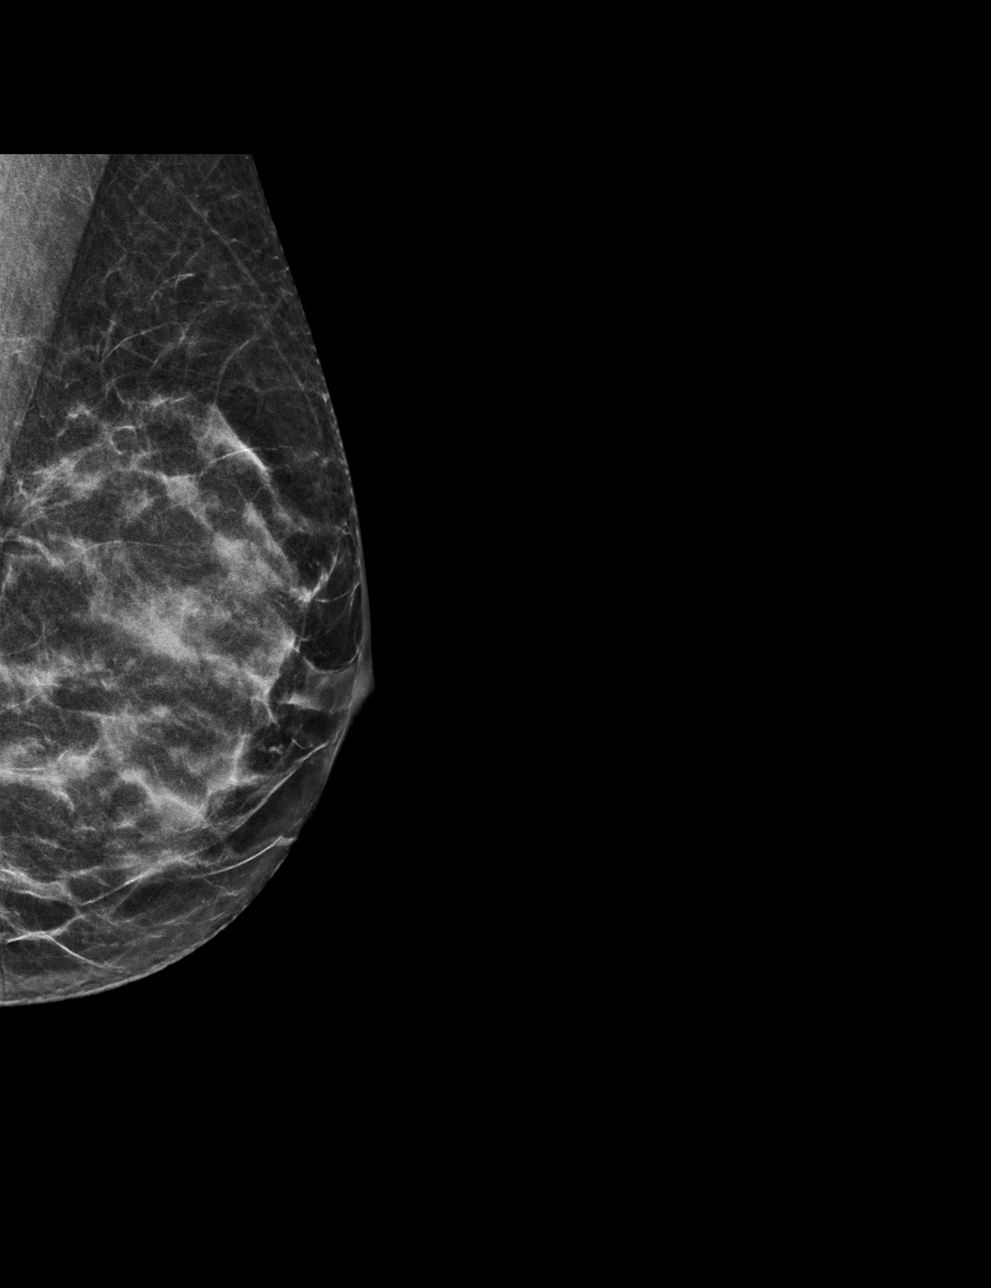

[L CC synth-2D]
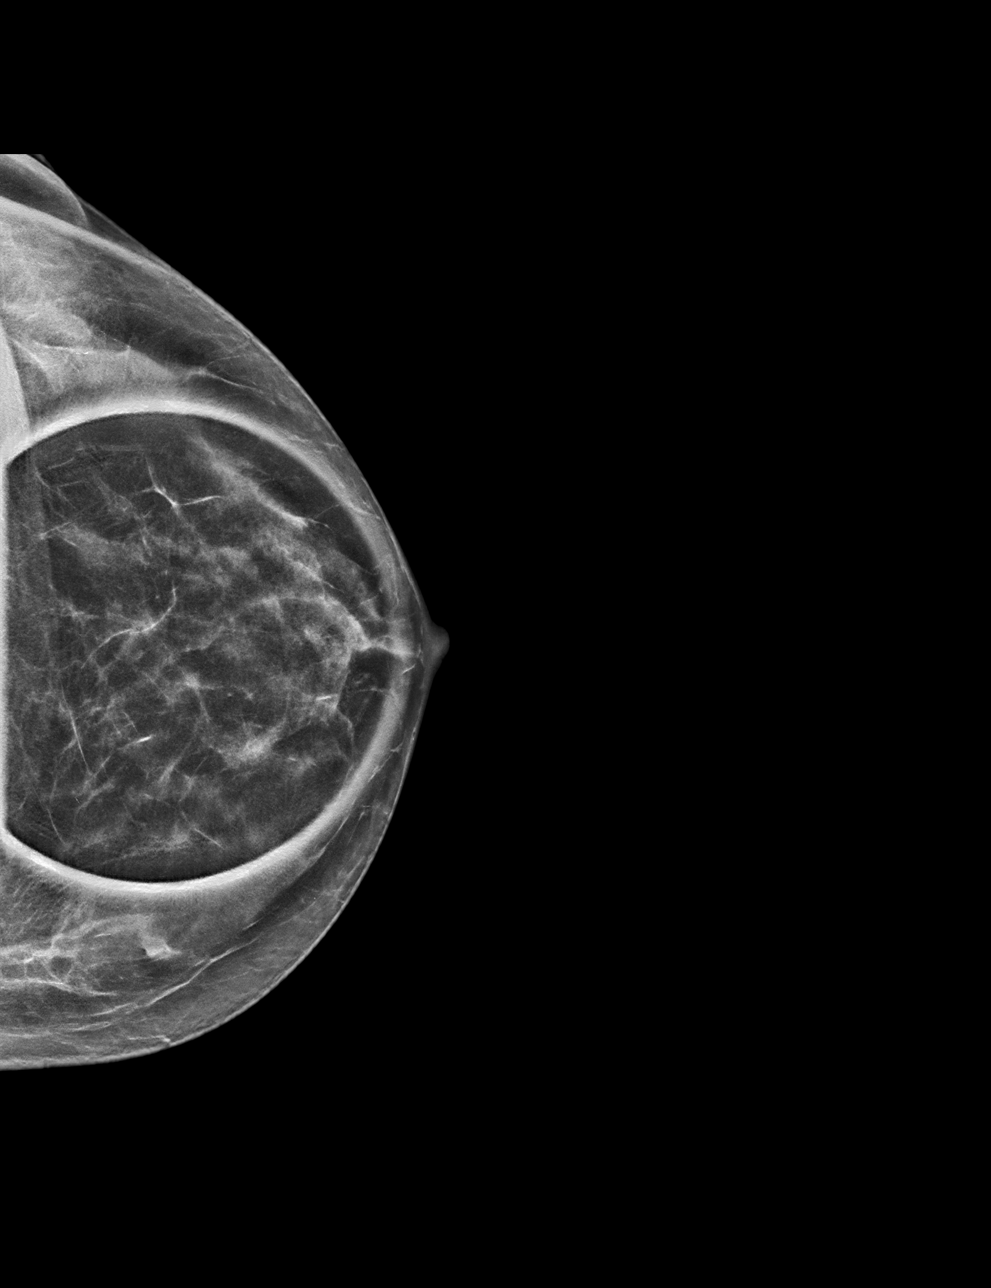

[L CC tomo · tomo slice 27/53.0]
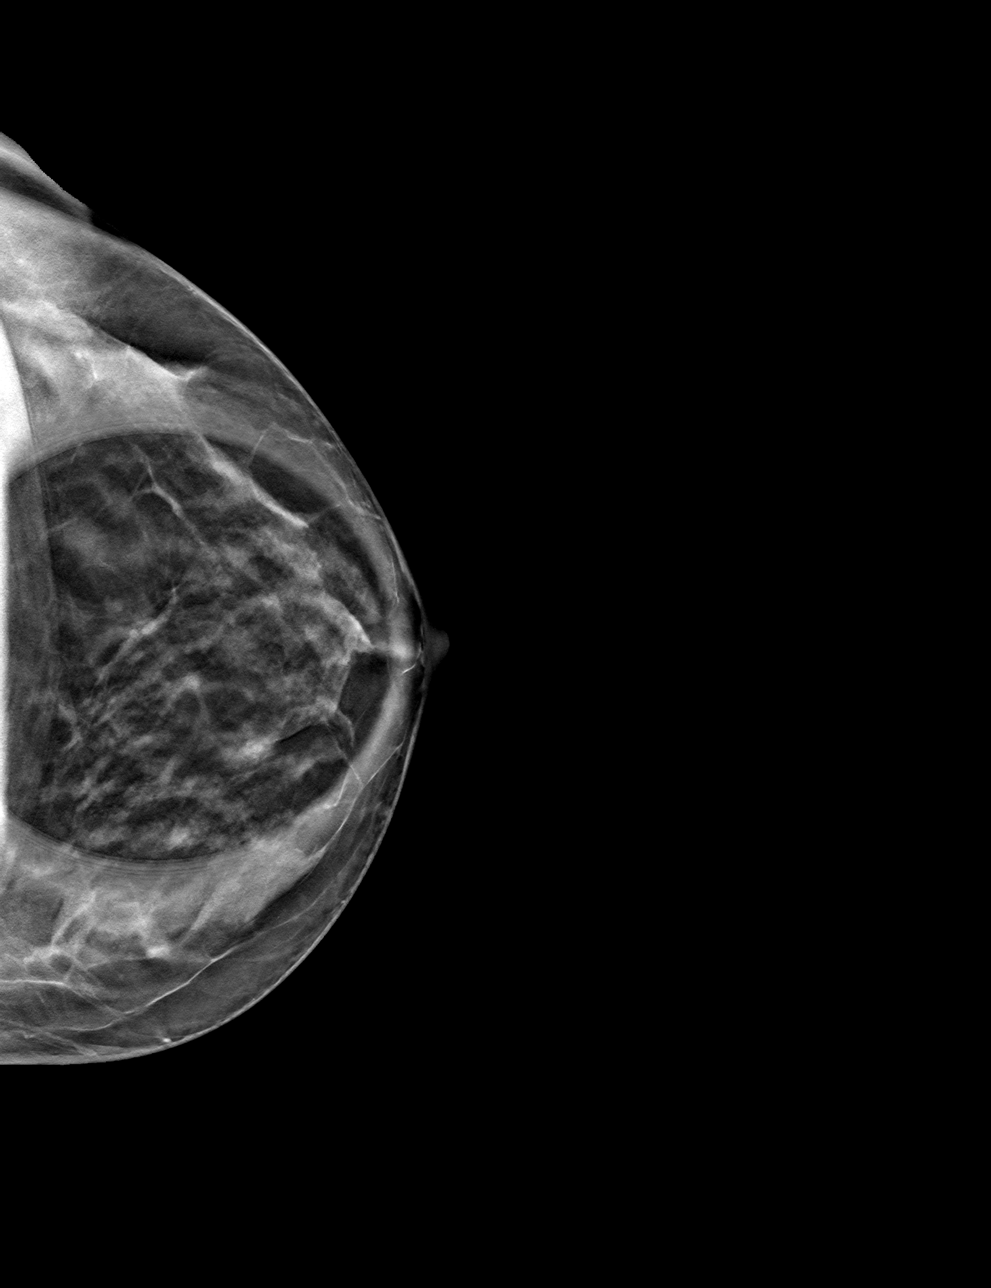

[L ML tomo · tomo slice 27/52.0]
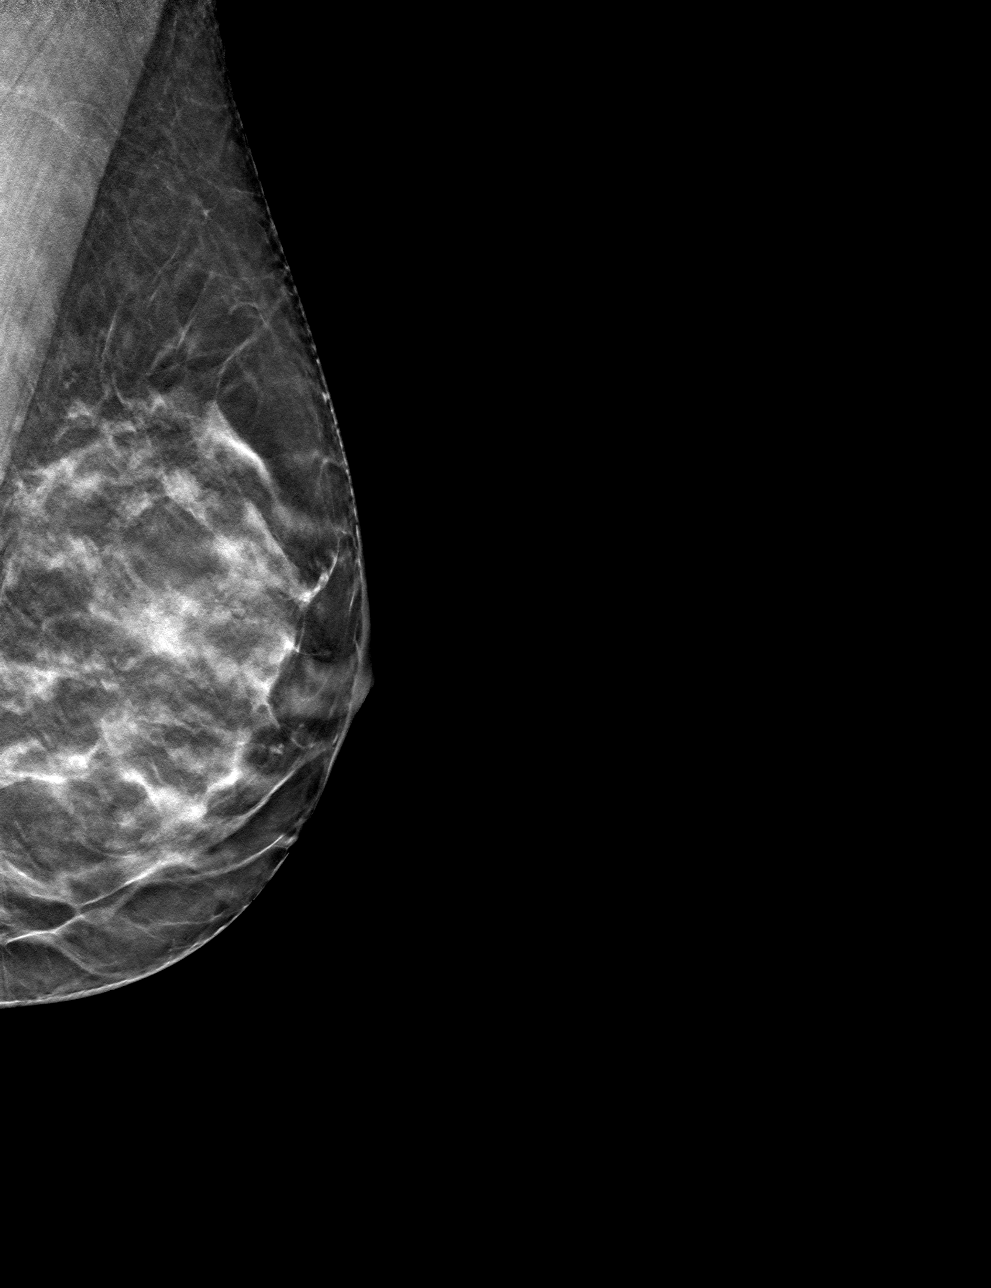

[4 of 12 positions shown; findings below may reference images not displayed]

ACR Breast Density Category c: The breast tissue is heterogeneously
dense, which may obscure small masses.
FINDINGS: Focal spot compression view of the central left breast with
tomography shows no evidence of mass or distortion. 90 degree
lateral view of the left breast with tomography is negative.

Mammographic images were processed with CAD.
IMPRESSION: No evidence of malignancy in the left breast.

RECOMMENDATION:
Screening mammogram in one year.(Code:92-[id number])

I have discussed the findings and recommendations with the patient.
Results were also provided in writing at the conclusion of the
visit. If applicable, a reminder letter will be sent to the patient
regarding the next appointment.

BI-RADS CATEGORY  1: Negative.

## 2018-10-19 DIAGNOSIS — M25522 Pain in left elbow: Secondary | ICD-10-CM | POA: Diagnosis not present

## 2018-10-19 DIAGNOSIS — M7502 Adhesive capsulitis of left shoulder: Secondary | ICD-10-CM | POA: Diagnosis not present

## 2018-10-19 DIAGNOSIS — M25512 Pain in left shoulder: Secondary | ICD-10-CM | POA: Diagnosis not present

## 2018-10-26 DIAGNOSIS — R1013 Epigastric pain: Secondary | ICD-10-CM | POA: Diagnosis not present

## 2018-10-26 DIAGNOSIS — K219 Gastro-esophageal reflux disease without esophagitis: Secondary | ICD-10-CM | POA: Diagnosis not present

## 2018-10-31 DIAGNOSIS — M25522 Pain in left elbow: Secondary | ICD-10-CM | POA: Diagnosis not present

## 2018-10-31 DIAGNOSIS — M25512 Pain in left shoulder: Secondary | ICD-10-CM | POA: Diagnosis not present

## 2018-10-31 DIAGNOSIS — M7502 Adhesive capsulitis of left shoulder: Secondary | ICD-10-CM | POA: Diagnosis not present

## 2018-11-01 DIAGNOSIS — D1801 Hemangioma of skin and subcutaneous tissue: Secondary | ICD-10-CM | POA: Diagnosis not present

## 2018-11-01 DIAGNOSIS — L821 Other seborrheic keratosis: Secondary | ICD-10-CM | POA: Diagnosis not present

## 2018-11-01 DIAGNOSIS — L853 Xerosis cutis: Secondary | ICD-10-CM | POA: Diagnosis not present

## 2018-11-01 DIAGNOSIS — C4441 Basal cell carcinoma of skin of scalp and neck: Secondary | ICD-10-CM | POA: Diagnosis not present

## 2018-11-01 DIAGNOSIS — L814 Other melanin hyperpigmentation: Secondary | ICD-10-CM | POA: Diagnosis not present

## 2018-11-09 DIAGNOSIS — M25512 Pain in left shoulder: Secondary | ICD-10-CM | POA: Diagnosis not present

## 2018-11-09 DIAGNOSIS — M25522 Pain in left elbow: Secondary | ICD-10-CM | POA: Diagnosis not present

## 2018-11-09 DIAGNOSIS — M7502 Adhesive capsulitis of left shoulder: Secondary | ICD-10-CM | POA: Diagnosis not present

## 2018-11-18 DIAGNOSIS — M25522 Pain in left elbow: Secondary | ICD-10-CM | POA: Diagnosis not present

## 2018-11-18 DIAGNOSIS — M7502 Adhesive capsulitis of left shoulder: Secondary | ICD-10-CM | POA: Diagnosis not present

## 2018-11-18 DIAGNOSIS — M25512 Pain in left shoulder: Secondary | ICD-10-CM | POA: Diagnosis not present

## 2018-12-01 DIAGNOSIS — M25522 Pain in left elbow: Secondary | ICD-10-CM | POA: Diagnosis not present

## 2018-12-01 DIAGNOSIS — M25512 Pain in left shoulder: Secondary | ICD-10-CM | POA: Diagnosis not present

## 2018-12-01 DIAGNOSIS — M7502 Adhesive capsulitis of left shoulder: Secondary | ICD-10-CM | POA: Diagnosis not present

## 2018-12-07 DIAGNOSIS — M25512 Pain in left shoulder: Secondary | ICD-10-CM | POA: Diagnosis not present

## 2018-12-07 DIAGNOSIS — M7502 Adhesive capsulitis of left shoulder: Secondary | ICD-10-CM | POA: Diagnosis not present

## 2018-12-07 DIAGNOSIS — M25522 Pain in left elbow: Secondary | ICD-10-CM | POA: Diagnosis not present

## 2018-12-20 DIAGNOSIS — M25512 Pain in left shoulder: Secondary | ICD-10-CM | POA: Diagnosis not present

## 2018-12-20 DIAGNOSIS — M5441 Lumbago with sciatica, right side: Secondary | ICD-10-CM | POA: Diagnosis not present

## 2018-12-27 ENCOUNTER — Ambulatory Visit: Payer: BC Managed Care – PPO | Admitting: Sports Medicine

## 2018-12-27 ENCOUNTER — Other Ambulatory Visit: Payer: Self-pay

## 2018-12-27 DIAGNOSIS — M25572 Pain in left ankle and joints of left foot: Secondary | ICD-10-CM | POA: Diagnosis not present

## 2018-12-27 NOTE — Assessment & Plan Note (Signed)
Today her foot pain is suggestive of longitudinal arch strain  For pickle ball I believe she will need more sports insole or custom orthotic cushion to support her arch.  and move to custom if needed

## 2018-12-27 NOTE — Progress Notes (Signed)
PCP: Tracey HarriesBouska, David, MD  Subjective:   HPI: Patient is a 58 y.o. female here for primarily left foot pain. Patient has been having medial Left foot pain since 1.5 weeks.This hurts along her arch. She denies any left ankle/foot injuries. The pain comes about sporadically during the day and she has not yet identified any particular motion that causes the pain. She plays pickle ball about 3 times weekly and walks mornings when she doesn't play pickle ball. She spoke with her physical therapist who recommended she not play pickle ball in her running shoes, but to instead purchase "court shoes". Otherwise she denies any fevers, myalgias, chest pain, shortness of breath, abdominal pain, sensory loss, or extremity weakness.  Past Medical History:  Diagnosis Date  . Headache   . Vertigo     Current Outpatient Medications on File Prior to Visit  Medication Sig Dispense Refill  . dicyclomine (BENTYL) 20 MG tablet Take 1 tablet (20 mg total) by mouth every 12 (twelve) hours as needed (for abdominal pain/cramping). 15 tablet 0  . ondansetron (ZOFRAN ODT) 4 MG disintegrating tablet Take 1 tablet (4 mg total) by mouth every 6 (six) hours as needed. 20 tablet 0  . pantoprazole (PROTONIX) 40 MG tablet Take 1 tablet (40 mg total) by mouth daily as needed (abdominal pain, bloating, reflux). 30 tablet 0   No current facility-administered medications on file prior to visit.     No past surgical history on file.  Allergies  Allergen Reactions  . Prednisone     Feels like skin crawls    Social History   Socioeconomic History  . Marital status: Married    Spouse name: Not on file  . Number of children: Not on file  . Years of education: Not on file  . Highest education level: Not on file  Occupational History  . Not on file  Social Needs  . Financial resource strain: Not on file  . Food insecurity    Worry: Not on file    Inability: Not on file  . Transportation needs    Medical: Not on file     Non-medical: Not on file  Tobacco Use  . Smoking status: Former Games developermoker  . Smokeless tobacco: Never Used  . Tobacco comment: in highschool  Substance and Sexual Activity  . Alcohol use: Yes    Comment: occasionally  . Drug use: Never  . Sexual activity: Not on file  Lifestyle  . Physical activity    Days per week: Not on file    Minutes per session: Not on file  . Stress: Not on file  Relationships  . Social Musicianconnections    Talks on phone: Not on file    Gets together: Not on file    Attends religious service: Not on file    Active member of club or organization: Not on file    Attends meetings of clubs or organizations: Not on file    Relationship status: Not on file  . Intimate partner violence    Fear of current or ex partner: Not on file    Emotionally abused: Not on file    Physically abused: Not on file    Forced sexual activity: Not on file  Other Topics Concern  . Not on file  Social History Narrative  . Not on file    Family History  Problem Relation Age of Onset  . Hypertension Mother   . Hypertension Father   . Cancer Father   . Diabetes  Father   . Coronary artery disease Father        History of CABG  . Parkinsonism Paternal Grandfather   . High Cholesterol Brother 42    BP 112/70   Ht 5\' 3"  (1.6 m)   Wt 130 lb (59 kg)   BMI 23.03 kg/m   Review of Systems: See HPI above.     Objective:  Physical Exam:  Gen: NAD, comfortable in exam room, very pleasant patient Respiratory: normal work of breathing Extremities:          Left foot: No deformity or swelling appreciated, no ecchymoses or rashes, no tenderness to palpation over navicular or cuneiform, brisk capillary refill, 2+ DP and TP pulses appreciated, minimally decreased medial longitudinal arch          Right foot: No deformity or swelling appreciated, no ecchymoses or rashes, no tenderness to palpation appreciated on exam, brisk capillary refill, 2+ DP and TP pulses appreciated Neuro: 5/5  strength with plantar and dorsi flexion to ankles and toes bilaterally  Note normal posterior tibial tendon function and normal Flexor hallucis function   Assessment & Plan:  1.  Medial foot pain: pain possibly due to decreasing medial longitudinal arch structure that is not being properly supported, medial arch cushion was placed to patient's tennis shoe cushion and decreased patient's discomfort -Continue normal activity with newly applied medial arch cushion providing more support to longitudinal arch -Patient was given information for ordering more medial arch cushions to be applied to other shoes  Patient will follow-up in 6 weeks on 9/15  If not improving adequately we will place her in custom orthoitcs.  Note her shoes should be fitted for comfort and court shoes she brought were too stiff and put pressure on her long arch.  We used a sports insole with a small scaphoid pad on the left only  Milus Banister, [place], PGY-2 12/27/2018 6:20 PM  I observed and examined the patient with the resident and agree with assessment and plan.  Note reviewed and modified by me. Ila Mcgill, MD

## 2019-01-02 DIAGNOSIS — C4441 Basal cell carcinoma of skin of scalp and neck: Secondary | ICD-10-CM | POA: Diagnosis not present

## 2019-01-26 DIAGNOSIS — L814 Other melanin hyperpigmentation: Secondary | ICD-10-CM | POA: Diagnosis not present

## 2019-01-26 DIAGNOSIS — L570 Actinic keratosis: Secondary | ICD-10-CM | POA: Diagnosis not present

## 2019-01-26 DIAGNOSIS — L308 Other specified dermatitis: Secondary | ICD-10-CM | POA: Diagnosis not present

## 2019-01-26 DIAGNOSIS — M25512 Pain in left shoulder: Secondary | ICD-10-CM | POA: Diagnosis not present

## 2019-01-26 DIAGNOSIS — L821 Other seborrheic keratosis: Secondary | ICD-10-CM | POA: Diagnosis not present

## 2019-01-26 DIAGNOSIS — M5441 Lumbago with sciatica, right side: Secondary | ICD-10-CM | POA: Diagnosis not present

## 2019-01-26 DIAGNOSIS — L578 Other skin changes due to chronic exposure to nonionizing radiation: Secondary | ICD-10-CM | POA: Diagnosis not present

## 2019-01-31 DIAGNOSIS — M25512 Pain in left shoulder: Secondary | ICD-10-CM | POA: Diagnosis not present

## 2019-01-31 DIAGNOSIS — M5441 Lumbago with sciatica, right side: Secondary | ICD-10-CM | POA: Diagnosis not present

## 2019-02-07 ENCOUNTER — Encounter: Payer: Self-pay | Admitting: Sports Medicine

## 2019-02-07 ENCOUNTER — Ambulatory Visit (INDEPENDENT_AMBULATORY_CARE_PROVIDER_SITE_OTHER): Payer: BC Managed Care – PPO | Admitting: Sports Medicine

## 2019-02-07 ENCOUNTER — Other Ambulatory Visit: Payer: Self-pay

## 2019-02-07 VITALS — BP 126/60 | Ht 63.0 in | Wt 128.0 lb

## 2019-02-07 DIAGNOSIS — M79672 Pain in left foot: Secondary | ICD-10-CM | POA: Diagnosis not present

## 2019-02-07 NOTE — Progress Notes (Signed)
PCP: Tracey HarriesBouska, David, MD  Subjective:   HPI: Patient is a 58 y.o. female here for follow-up of left foot pain.  Patient was last seen on August 4 for evaluation of her pain.  At that time she was found to have slight flattening of her longitudinal and some of transverse arches.  She was given a temporary green orthotic for her shoe along with scaphoid pads.  She notes after several days of wearing this she developed pain in the lateral aspect of her foot and had the scaphoid pads removed.  She has not used the temporary green orthotic since then.  She was unable to follow-up initially due to having a basal cell carcinoma removed from her head.  Patient notes her pain has improved slightly since her last visit but mostly due to relative rest from her recent surgery.  She still gets medial foot pain on her left foot with activity.  She currently walks for exercise as well as plays pickle ball.  Pain does not radiate.  She denies any swelling or bruising.  She has no numbness or tingling.   Review of Systems: See HPI above.  Past Medical History:  Diagnosis Date  . Headache   . Vertigo     Current Outpatient Medications on File Prior to Visit  Medication Sig Dispense Refill  . dicyclomine (BENTYL) 20 MG tablet Take 1 tablet (20 mg total) by mouth every 12 (twelve) hours as needed (for abdominal pain/cramping). 15 tablet 0  . ondansetron (ZOFRAN ODT) 4 MG disintegrating tablet Take 1 tablet (4 mg total) by mouth every 6 (six) hours as needed. 20 tablet 0  . pantoprazole (PROTONIX) 40 MG tablet Take 1 tablet (40 mg total) by mouth daily as needed (abdominal pain, bloating, reflux). 30 tablet 0   No current facility-administered medications on file prior to visit.     History reviewed. No pertinent surgical history.  Allergies  Allergen Reactions  . Prednisone     Feels like skin crawls    Social History   Socioeconomic History  . Marital status: Married    Spouse name: Not on file  .  Number of children: Not on file  . Years of education: Not on file  . Highest education level: Not on file  Occupational History  . Not on file  Social Needs  . Financial resource strain: Not on file  . Food insecurity    Worry: Not on file    Inability: Not on file  . Transportation needs    Medical: Not on file    Non-medical: Not on file  Tobacco Use  . Smoking status: Former Games developermoker  . Smokeless tobacco: Never Used  . Tobacco comment: in highschool  Substance and Sexual Activity  . Alcohol use: Yes    Comment: occasionally  . Drug use: Never  . Sexual activity: Not on file  Lifestyle  . Physical activity    Days per week: Not on file    Minutes per session: Not on file  . Stress: Not on file  Relationships  . Social Musicianconnections    Talks on phone: Not on file    Gets together: Not on file    Attends religious service: Not on file    Active member of club or organization: Not on file    Attends meetings of clubs or organizations: Not on file    Relationship status: Not on file  . Intimate partner violence    Fear of current or ex  partner: Not on file    Emotionally abused: Not on file    Physically abused: Not on file    Forced sexual activity: Not on file  Other Topics Concern  . Not on file  Social History Narrative  . Not on file    Family History  Problem Relation Age of Onset  . Hypertension Mother   . Hypertension Father   . Cancer Father   . Diabetes Father   . Coronary artery disease Father        History of CABG  . Parkinsonism Paternal Grandfather   . High Cholesterol Brother 42        Objective:  Physical Exam: BP 126/60   Ht 5\' 3"  (1.6 m)   Wt 128 lb (58.1 kg)   BMI 22.67 kg/m  Gen: NAD, comfortable in exam room Lungs: Breathing comfortably on room air Ankle/Foot Exam Left -Inspection: Slight collapse of the transverse and longitudinal arch -Palpation: Medial malleolus: non-tender; lateral malleolus: non-tender; 5th metatarsal:  non-tender; calcaneus: non-tender; plantar fascia insertion: non-tender -ROM: Dorsiflexion: 20 degrees; plantarflexion: 50 degrees; Inversion: 35 degrees; Eversion: 25 degrees -Strength: Dorsiflexion: 5/5; Plantarflexion: 5/5; Inversion: 5/5; Eversion: 5/5 -Special Tests: Anterior drawer: negative; Talar tilt: Negative; Calcaneal squeeze: Negative; Tib/fib: Negative -Limb neurovascularly intact  Contralateral Ankle -Inspection: Slight collapse of the transverse and longitudinal arch -Palpation: Medial malleolus: non-tender; lateral malleolus: non-tender; 5th metatarsal: non-tender; calcaneus: non-tender; plantar fascia insertion: non-tender -ROM: Dorsiflexion: 20 degrees; plantarflexion: 50 degrees; Inversion: 35 degrees; Eversion: 25 degrees -Strength: Dorsiflexion: 5/5; Plantarflexion: 5/5; Inversion: 5/5; Eversion: 5/5 -Limb neurovascularly intact  -Gait: Normal    Assessment & Plan:  Patient is a 58 y.o. female here for follow-up of left foot pain  1.  Left foot pain -Patient with loss of her longitudinal transverse arch that is likely contributing to her pain -Patient was fitted for a : standard, cushioned, semi-rigid orthotic. The orthotic was heated and afterward the patient stood on the orthotic blank positioned on the orthotic stand. The patient was positioned in subtalar neutral position and 10 degrees of ankle dorsiflexion in a weight bearing stance. After completion of molding, a stable base was applied to the orthotic blank. The blank was ground to a stable position for weight bearing. Size: 8 Base: medium density black EVA Additional orthotic padding: None  Gait analysis: Custom orthotic revealed a normal, neutral gait  Patient to follow-up as needed  I observed and examined the patient with Dr. Sheppard Coil and agree with assessment and plan.  Note reviewed and modified by me. Ila Mcgill, MD

## 2019-02-09 DIAGNOSIS — R079 Chest pain, unspecified: Secondary | ICD-10-CM | POA: Diagnosis not present

## 2019-02-09 DIAGNOSIS — R1013 Epigastric pain: Secondary | ICD-10-CM | POA: Diagnosis not present

## 2019-07-25 ENCOUNTER — Ambulatory Visit: Payer: 59 | Admitting: Neurology

## 2019-10-30 NOTE — Progress Notes (Signed)
NEUROLOGY FOLLOW UP OFFICE NOTE  [name] [id number]  HISTORY OF PRESENT ILLNESS: Dana [name] is a 59 year old left-handed woman who follows up for migraines.  UPDATE: Overall, migraines are okay.  She continues to have sharp and dull pain from her left shoulder radiating up the left sided of her neck to the back of head and sometimes to the left cheek.  Questionable "lock jaw".   Rescue therapy:  Tylenol first line; sumatriptan 100mg  second line (hasn't needed).   HISTORY: In 2006, she had a viral illness, in which she had syncope and developed diffuse weakness and paresthesias. Neurologic workup and physical exam was normal.  I BPPV: She has had approximately 3 episodes of vertigo, described as spinning. In 2012, she woke up one morning with spinning sensation. It would last a few seconds with head movement. She also had nasal congestion. She was treated with antibiotics, which were ineffective. CT of her sinuses and MRI of brain with and without contrast from April 2012 were unremarkable. She had a positive Dix-Hallpike and was diagnosed with BPPV. She had another episode about a year later, which was severe and lasted a few seconds but afterwards she was nauseous and vomited. MRI and MRA of head from 04/28/12 was unremarkable. She had one other episode a few months ago, which quickly resolved. She has been evaluated by ENT and has deviated septum to the right.  II MIGRAINES: She started having migraines in young adulthood. They usually occur at the back of her head. Typically they are mild but can be a severe pounding ache. When severe, they are accompanied by nausea and photophobia. They are preceded by visual aura of zigzag lines. She reports a prodrome of feeling strange. They occur for the day but she does not take any pain reliever for it. They occur once in a while, such as every 2 to several months.   There are no triggers.  Laying down to rest  helps relieve it.  III INTERMITTENT LEFT FACIAL TINGLING: On a couple of occasions, she reports brief episodes of left facial tingling. It occurred once in December 2016. It was thought to be due to her neck. It only lasts a day. She denied neck pain, facial droop, headache or focal numbness and weakness. They are not associated with her migraines.  PAST MEDICAL HISTORY: Past Medical History:  Diagnosis Date  . Headache   . Vertigo     MEDICATIONS: Current Outpatient Medications on File Prior to Visit  Medication Sig Dispense Refill  . dicyclomine (BENTYL) 20 MG tablet Take 1 tablet (20 mg total) by mouth every 12 (twelve) hours as needed (for abdominal pain/cramping). 15 tablet 0  . ondansetron (ZOFRAN ODT) 4 MG disintegrating tablet Take 1 tablet (4 mg total) by mouth every 6 (six) hours as needed. 20 tablet 0  . pantoprazole (PROTONIX) 40 MG tablet Take 1 tablet (40 mg total) by mouth daily as needed (abdominal pain, bloating, reflux). 30 tablet 0   No current facility-administered medications on file prior to visit.    ALLERGIES: Allergies  Allergen Reactions  . Prednisone     Feels like skin crawls    FAMILY HISTORY: Family History  Problem Relation Age of Onset  . Hypertension Mother   . Hypertension Father   . Cancer Father   . Diabetes Father   . Coronary artery disease Father        History of CABG  . Parkinsonism Paternal Grandfather   . High Cholesterol  Brother 49    SOCIAL HISTORY: Social History   Socioeconomic History  . Marital status: Married    Spouse name: Not on file  . Number of children: Not on file  . Years of education: Not on file  . Highest education level: Not on file  Occupational History  . Not on file  Tobacco Use  . Smoking status: Former Games developer  . Smokeless tobacco: Never Used  . Tobacco comment: in highschool  Substance and Sexual Activity  . Alcohol use: Yes    Comment: occasionally  . Drug use: Never  . Sexual  activity: Not on file  Other Topics Concern  . Not on file  Social History Narrative  . Not on file   Social Determinants of Health   Financial Resource Strain:   . Difficulty of Paying Living Expenses:   Food Insecurity:   . Worried About Programme researcher, broadcasting/film/video in the Last Year:   . Barista in the Last Year:   Transportation Needs:   . Freight forwarder (Medical):   Marland Kitchen Lack of Transportation (Non-Medical):   Physical Activity:   . Days of Exercise per Week:   . Minutes of Exercise per Session:   Stress:   . Feeling of Stress :   Social Connections:   . Frequency of Communication with Friends and Family:   . Frequency of Social Gatherings with Friends and Family:   . Attends Religious Services:   . Active Member of Clubs or Organizations:   . Attends Banker Meetings:   Marland Kitchen Marital Status:   Intimate Partner Violence:   . Fear of Current or Ex-Partner:   . Emotionally Abused:   Marland Kitchen Physically Abused:   . Sexually Abused:     PHYSICAL EXAM: Blood pressure (!) 144/77, pulse 70, height 5\' 3"  (1.6 m), weight 137 lb (62.1 kg), SpO2 98 %. General: No acute distress.  Patient appears well-groomed.   Head:  Normocephalic/atraumatic Eyes:  Fundi examined but not visualized Neck: supple, no paraspinal tenderness, full range of motion Heart:  Regular rate and rhythm Lungs:  Clear to auscultation bilaterally Back: No paraspinal tenderness Neurological Exam: alert and oriented to person, place, and time. Attention span and concentration intact, recent and remote memory intact, fund of knowledge intact.  Speech fluent and not dysarthric, language intact.  CN II-XII intact. Bulk and tone normal, muscle strength 5/5 throughout.  Sensation to light touch, temperature and vibration intact.  Deep tendon reflexes 2+ throughout, toes downgoing.  Finger to nose and heel to shin testing intact.  Gait normal, Romberg negative.  IMPRESSION: 1.  Migraine with aura, without  status migrainosus, not intractable 2.  Left sided cervicalgia  PLAN: 1.  For neck and back pain, will refer to Dr. of [facility] 2.  For abortive therapy, Tylenol (sumatriptan as second line, refilled) 3.  Limit use of pain relievers to no more than 2 days out of week to prevent risk of rebound or medication-overuse headache. 4.  Keep headache diary 5.  Exercise, hydration, caffeine cessation, sleep hygiene, monitor for and avoid triggers 6. Follow up one year   Antoine Primas, DO  CC: Shon Millet, MD

## 2019-10-31 ENCOUNTER — Other Ambulatory Visit: Payer: Self-pay

## 2019-10-31 ENCOUNTER — Ambulatory Visit: Payer: BC Managed Care – PPO | Admitting: Neurology

## 2019-10-31 ENCOUNTER — Encounter: Payer: Self-pay | Admitting: Neurology

## 2019-10-31 VITALS — BP 144/77 | HR 70 | Ht 63.0 in | Wt 137.0 lb

## 2019-10-31 DIAGNOSIS — M549 Dorsalgia, unspecified: Secondary | ICD-10-CM

## 2019-10-31 DIAGNOSIS — M542 Cervicalgia: Secondary | ICD-10-CM

## 2019-10-31 DIAGNOSIS — G43109 Migraine with aura, not intractable, without status migrainosus: Secondary | ICD-10-CM

## 2019-10-31 DIAGNOSIS — G8929 Other chronic pain: Secondary | ICD-10-CM

## 2019-10-31 DIAGNOSIS — M7912 Myalgia of auxiliary muscles, head and neck: Secondary | ICD-10-CM

## 2019-10-31 MED ORDER — SUMATRIPTAN SUCCINATE 100 MG PO TABS: ORAL_TABLET | ORAL | 0 refills | Status: DC

## 2019-10-31 NOTE — Patient Instructions (Signed)
1.  Refilled sumatriptan 2.  For neck, back and shoulder, will refer you to Dr. Antoine Primas at [facility] 3.  Follow up in one year

## 2019-11-02 ENCOUNTER — Other Ambulatory Visit: Payer: Self-pay

## 2019-11-02 ENCOUNTER — Ambulatory Visit (INDEPENDENT_AMBULATORY_CARE_PROVIDER_SITE_OTHER): Payer: BC Managed Care – PPO | Admitting: Family Medicine

## 2019-11-02 ENCOUNTER — Encounter: Payer: Self-pay | Admitting: Family Medicine

## 2019-11-02 VITALS — BP 128/66 | HR 57 | Ht 63.0 in | Wt 136.0 lb

## 2019-11-02 DIAGNOSIS — M999 Biomechanical lesion, unspecified: Secondary | ICD-10-CM | POA: Diagnosis not present

## 2019-11-02 DIAGNOSIS — M542 Cervicalgia: Secondary | ICD-10-CM | POA: Diagnosis not present

## 2019-11-02 MED ORDER — VITAMIN D (ERGOCALCIFEROL) 1.25 MG (50000 UNIT) PO CAPS: 50000.0000 [IU] | ORAL_CAPSULE | ORAL | 0 refills | Status: DC

## 2019-11-02 NOTE — Patient Instructions (Signed)
Thicker grip on raquet Once weekly Vit D Tart cherry extract 1200mg  at night Everly Eat with in 30 min of working out  See me in 5-6 weeks

## 2019-11-02 NOTE — Progress Notes (Signed)
[facility] End[facility] [place] Phone: [phone] Subjective:   Dana [name], am serving as a scribe for Dr. Hulan Saas. This visit occurred during the SARS-CoV-2 public health emergency.  Safety protocols were in place, including screening questions prior to the visit, additional usage of staff PPE, and extensive cleaning of exam room while observing appropriate contact time as indicated for disinfecting solutions.   I'm seeing this patient by the request  of:  Bernerd Limbo, MD, Tomi Likens DO   CC: Neck pain and back pain  [id number]  Dana [name] is a 59 y.o. female coming in with complaint of neck pain. Patient states that she has been doing physical therapy with Barbaraann Barthel over the years. Referred to [name] by Dr. Tomi Likens. Intermittently gets tingling in left side of cervical spine and into the face. History of frozen shoulder on left side.   Currently patient has pain in between scapula. Also has lower back pain that is chronic.   Also is developing trigger finger in left hand middle finger.   History of stomach issues so she tries to avoid all medications.   Plays pickleball 4-5 days a week for 2 hours.  Patient did have a significant work-up for this back in 2006 which included MRIs of the entire axial spine.  This was independently visualized by me showing a very small amount of spondylosis from T12-L5 with minimal facet arthropathy.  [name] signs of any central spinal stenosis.    Past Medical History:  Diagnosis Date  . Headache   . Vertigo    [name] past surgical history on file. Social History   Socioeconomic History  . Marital status: Married    Spouse name: Not on file  . Number of children: Not on file  . Years of education: Not on file  . Highest education level: Not on file  Occupational History  . Not on file  Tobacco Use  . Smoking status: Former Research scientist (life sciences)  . Smokeless tobacco: Never Used  . Tobacco  comment: in highschool  Vaping Use  . Vaping Use: Never used  Substance and Sexual Activity  . Alcohol use: Yes    Comment: occasionally  . Drug use: Never  . Sexual activity: Not on file  Other Topics Concern  . Not on file  Social History Narrative   Left handed   Lives with husband two story home   Social Determinants of Health   Financial Resource Strain:   . Difficulty of Paying Living Expenses:   Food Insecurity:   . Worried About Charity fundraiser in the Last Year:   . Arboriculturist in the Last Year:   Transportation Needs:   . Film/video editor (Medical):   Marland Kitchen Lack of Transportation (Non-Medical):   Physical Activity:   . Days of Exercise per Week:   . Minutes of Exercise per Session:   Stress:   . Feeling of Stress :   Social Connections:   . Frequency of Communication with Friends and Family:   . Frequency of Social Gatherings with Friends and Family:   . Attends Religious Services:   . Active Member of Clubs or Organizations:   . Attends Archivist Meetings:   Marland Kitchen Marital Status:    Allergies  Allergen Reactions  . Prednisone     Feels like skin crawls   Family History  Problem Relation Age of Onset  . Hypertension Mother   .  Hypertension Father   . Cancer Father   . Diabetes Father   . Coronary artery disease Father        History of CABG  . Parkinsonism Paternal Grandfather   . High Cholesterol Brother 42       Current Outpatient Medications (Analgesics):  Marland Kitchen  SUMAtriptan (IMITREX) 100 MG tablet, Take 1 tablet earliest onset of migraine.  May repeat once in 2 hours if headache persists or recurs.  Do not exceed 2 tablets in 24 hours   Current Outpatient Medications (Other):  .  dicyclomine (BENTYL) 20 MG tablet, Take 1 tablet (20 mg total) by mouth every 12 (twelve) hours as needed (for abdominal pain/cramping). .  ondansetron (ZOFRAN ODT) 4 MG disintegrating tablet, Take 1 tablet (4 mg total) by mouth every 6 (six) hours as  needed. .  pantoprazole (PROTONIX) 40 MG tablet, Take 1 tablet (40 mg total) by mouth daily as needed (abdominal pain, bloating, reflux). .  Vitamin D, Ergocalciferol, (DRISDOL) 1.25 MG (50000 UNIT) CAPS capsule, Take 1 capsule (50,000 Units total) by mouth every 7 (seven) days.   Reviewed prior external information including notes and imaging from  primary care provider As well as notes that were available from care everywhere and other healthcare systems.  Past medical history, social, surgical and family history all reviewed in electronic medical record.  [name] pertanent information unless stated regarding to the chief complaint.   Review of Systems:  [name] headache, visual changes, nausea, vomiting, diarrhea, constipation, dizziness, abdominal pain, skin rash, fevers, chills, night sweats, weight loss, swollen lymph nodes,  joint swelling, chest pain, shortness of breath, mood changes. POSITIVE muscle aches, body aches  Objective  Blood pressure 128/66, pulse (!) 57, height 5\' 3"  (1.6 m), weight 136 lb (61.7 kg), SpO2 98 %.   General: [name] apparent distress alert and oriented x3 mood and affect normal, dressed appropriately.  HEENT: Pupils equal, extraocular movements intact  Respiratory: Patient's speak in full sentences and does not appear short of breath  Cardiovascular: [name] lower extremity edema, non tender, [name] erythema  Neuro: Cranial nerves II through XII are intact, neurovascularly intact in all extremities with 2+ DTRs and 2+ pulses.  Gait normal with good balance and coordination.  MSK:  tender with full range of motion and good stability and symmetric strength and tone of shoulders, elbows, wrist, hip, knee and ankles bilaterally.  Hand exam does show the patient does have some mild DIP joint contracture noted but [name] triggering of the fingers  Neck exam shows the patient does have some loss of lordosis.  Negative Spurling's.  Tightness noted in the parascapular region right greater than  left.  5-5 strength of the upper extremities.  Mild crepitus with range of motion.  Does have some weakness of the shoulder girdles bilaterally.  Low back exam does have some mild loss of lordosis as well.  Tightness with FABER test bilaterally but able to do so.  Minimal decrease in internal rotation of the hips bilaterally.  Deep tendon reflexes and neurovascular intact with 5 out of 5 strength of the lower extremities.   Osteopathic findings C2 flexed rotated and side bent right C7 flexed rotated and side bent left T3 extended rotated and side bent right inhaled third rib T8 extended rotated and side bent left L2 flexed rotated and side bent right Sacrum right on right    Impression and Recommendations:     The above documentation has been reviewed and is accurate and complete  Koren Bound, DO       Note: This dictation was prepared with Dragon dictation along with smaller phrase technology. Any transcriptional errors that result from this process are unintentional.

## 2019-11-03 ENCOUNTER — Encounter: Payer: Self-pay | Admitting: Family Medicine

## 2019-11-03 DIAGNOSIS — M999 Biomechanical lesion, unspecified: Secondary | ICD-10-CM

## 2019-11-03 HISTORY — DX: Biomechanical lesion, unspecified: M99.9

## 2019-11-03 NOTE — Assessment & Plan Note (Signed)

## 2019-11-03 NOTE — Assessment & Plan Note (Signed)
Do appear that most of the neck pain can be secondary to the sternocleidomastoid but also I do feel that is likely causing more cervicogenic headaches.  Patient does have a history of migraines attempted osteopathic manipulation which I think will be beneficial as well.  Discussed with patient continuing to have fatigue from time to time about the possibility of different vitamin supplementations.  Patient does believe that some of her other problems could be secondary to diet relation and we gave her guidance on if we wanted to do any food testing.  Patient is to increase activity slowly.  Follow-up again in 4 to 8 weeks.

## 2019-11-20 ENCOUNTER — Ambulatory Visit: Payer: BC Managed Care – PPO | Admitting: Cardiology

## 2019-11-20 ENCOUNTER — Other Ambulatory Visit: Payer: Self-pay

## 2019-11-20 ENCOUNTER — Encounter: Payer: Self-pay | Admitting: Cardiology

## 2019-11-20 VITALS — BP 124/80 | HR 79 | Temp 97.2°F | Ht 63.5 in | Wt 138.0 lb

## 2019-11-20 DIAGNOSIS — R072 Precordial pain: Secondary | ICD-10-CM | POA: Diagnosis not present

## 2019-11-20 DIAGNOSIS — Z8249 Family history of ischemic heart disease and other diseases of the circulatory system: Secondary | ICD-10-CM | POA: Diagnosis not present

## 2019-11-20 NOTE — Progress Notes (Signed)
Primary Care Provider: Bernerd Limbo, MD Cardiologist: No primary care provider on file. Electrophysiologist: None  Clinic Note: Chief Complaint  Patient presents with  . Follow-up    For cardiac risk factors   HPI:    Dana [name] is a 59 y.o. female who is being seen today for reevaluation with family history of CAD and concern of her own risk factors ->  at the request of Bernerd Limbo, MD.  Dana [name] was last seen on on Sep 29, 2017 =--> We ordered a coronary calcium score and CTA from his appointment, but was never completed -> she was concerned about having to take a beta-blocker.  Recent Hospitalizations: None  Reviewed  CV studies:    The following studies were reviewed today: (if available, images/films reviewed: From Epic Chart or Care Everywhere) . None:   Interval History:   Dana [name] presents here today overall stating that she is "quite healthy from an exercise standpoint, but is just very worried about her risk factors and her family history ".  She plays at least 1 to 1-1/2 hours of pickle ball just about every day and usually does fine but every now and then she will feel really worn out 1 or 2 hours after playing.  She says sometimes she gets her heart rate as high as 160 beats a minute and feels like it takes a while to come back down again, but that is not very rare. Now with routine activity, she does not notice any untoward symptoms.  She is doing her best to not get herself out of shape. She said that discussing with her PCP, she thought that maybe she needed a screening stress test, however when I talk with her she is easily exercising to a greater extent than the treadmill stress test would test.  She has intermittent episodes of tightness or squeezing in her chest usually that occurs after exercising not with exercising.  She plays a lot of pickleball and swings her arms a lot.  Usually if she goes several days without plan, this chest  discomfort is not present.  CV Review of Symptoms (Summary) Cardiovascular ROS: no chest pain or dyspnea on exertion positive for - irregular heartbeat, palpitations and Anxiety about her health; intermittent shortness of breath spells negative for - paroxysmal nocturnal dyspnea or Syncope/near syncope, TIA/amaurosis fugax, claudication Seems very hypervigilant about her baseline health  The patient does not have symptoms concerning for COVID-19 infection (fever, chills, cough, or new shortness of breath).  The patient is practicing social distancing & Masking.   Had COVID-19 vaccine second injection in May 2021  REVIEWED OF SYSTEMS   Review of Systems  Constitutional: Negative for malaise/fatigue and weight loss.  Respiratory: Positive for shortness of breath (Only after a vigorous exercise regimen or pickleball).   Cardiovascular: Negative for leg swelling.  Gastrointestinal: Positive for abdominal pain (Bloating, gas) and heartburn. Negative for blood in stool, diarrhea and melena.  Genitourinary: Negative for frequency and hematuria.  Musculoskeletal: Positive for back pain and neck pain. Negative for falls and joint pain.  Neurological: Positive for dizziness. Negative for speech change, focal weakness and weakness.  Psychiatric/Behavioral: Negative for depression and memory loss. The patient is nervous/anxious.    I have reviewed and (if needed) personally updated the patient's problem list, medications, allergies, past medical and surgical history, social and family history.   PAST MEDICAL HISTORY   Past Medical History:  Diagnosis Date  . Abnormality of gait  07/18/2015  . Adhesive capsulitis of left shoulder 12/23/2017   Documented by her PCP and treated by Sharen Hones  . AR (allergic rhinitis) 11/08/2011  . BPPV (benign paroxysmal positional vertigo) 07/31/2015  . Chest pain, precordial 09/30/2017  . Disturbance in sleep behavior 12/30/2017  . DOE (dyspnea on exertion)  09/29/2017  . Family history of early CAD 09/29/2017  . Headache   . Insomnia 11/08/2011  . Leg length discrepancy 12/30/2017  . Menopause 11/08/2011   2009   . Migraine with aura and without status migrainosus, not intractable 07/31/2015  . Nasal septal deviation 11/08/2011  . Nonallopathic lesion of cervical region 11/03/2019  . Sternocleidomastoid muscle tenderness 12/30/2017  . Vertigo     PAST SURGICAL HISTORY   History reviewed. No pertinent surgical history.  MEDICATIONS/ALLERGIES   Current Meds  Medication Sig  . dicyclomine (BENTYL) 20 MG tablet Take 1 tablet (20 mg total) by mouth every 12 (twelve) hours as needed (for abdominal pain/cramping).  Marland Kitchen esomeprazole (NEXIUM) 40 MG capsule Take by mouth.  . ondansetron (ZOFRAN ODT) 4 MG disintegrating tablet Take 1 tablet (4 mg total) by mouth every 6 (six) hours as needed.  . pantoprazole (PROTONIX) 40 MG tablet Take 1 tablet (40 mg total) by mouth daily as needed (abdominal pain, bloating, reflux).  . SUMAtriptan (IMITREX) 100 MG tablet Take 1 tablet earliest onset of migraine.  May repeat once in 2 hours if headache persists or recurs.  Do not exceed 2 tablets in 24 hours  . Vitamin D, Ergocalciferol, (DRISDOL) 1.25 MG (50000 UNIT) CAPS capsule Take 1 capsule (50,000 Units total) by mouth every 7 (seven) days.    Allergies  Allergen Reactions  . Prednisone     Feels like skin crawls    SOCIAL HISTORY/FAMILY HISTORY   Reviewed in Epic:  Pertinent findings: No new changes  OBJCTIVE -PE, EKG, labs   Wt Readings from Last 3 Encounters:  11/20/19 138 lb (62.6 kg)  11/02/19 136 lb (61.7 kg)  10/31/19 137 lb (62.1 kg)    Physical Exam: BP 124/80   Pulse 79   Temp (!) 97.2 F (36.2 C)   Ht 5' 3.5" (1.613 m)   Wt 138 lb (62.6 kg)   SpO2 95%   BMI 24.06 kg/m  Physical Exam Constitutional:      General: She is not in acute distress.    Appearance: Normal appearance. She is normal weight.  HENT:     Head: Normocephalic and  atraumatic.  Eyes:     Extraocular Movements: Extraocular movements intact.     Pupils: Pupils are equal, round, and reactive to light.  Neck:     Vascular: No carotid bruit.  Cardiovascular:     Rate and Rhythm: Normal rate.     Heart sounds: Normal heart sounds. No murmur heard.  No friction rub. No gallop.   Pulmonary:     Effort: Pulmonary effort is normal.     Breath sounds: Normal breath sounds. No wheezing or rhonchi.  Abdominal:     Palpations: There is no mass.     Hernia: No hernia is present.  Musculoskeletal:     Cervical back: Normal range of motion. No tenderness.  Neurological:     Mental Status: She is alert.  Psychiatric:        Mood and Affect: Mood normal.        Behavior: Behavior normal.        Thought Content: Thought content normal.  Judgment: Judgment normal.     Comments: Somewhat anxious      Adult ECG Report  Rate: 79 ;  Rhythm: normal sinus rhythm and Normal axis, intervals durations.;   Narrative Interpretation: Normal EKG  Recent Labs: Has not had labs checked recently. Lab Results  Component Value Date   CHOL 221 (H) 02/05/2015   HDL 80 02/05/2015   LDLCALC 126 02/05/2015   TRIG 77 02/05/2015   CHOLHDL 2.8 02/05/2015   Lab Results  Component Value Date   CREATININE 0.94 09/19/2018   BUN 10 09/19/2018   NA 137 09/19/2018   K 3.6 09/19/2018   CL 101 09/19/2018   CO2 23 09/19/2018   Lab Results  Component Value Date   TSH 2.278 02/05/2015    ASSESSMENT/PLAN   Dana [name] seems very active and otherwise healthy.  I do not have her recent labs from her PCP, but we discussed the cardiovascular risk.  I talked about different testing options for screening.  Screening treadmill stress test would not likely be useful given how active she is with her exercise.  And imaging tests would not be warranted as she is not actively having symptoms.  She describes intermittent chest discomfort spells which are not exertional, and without any  particular rhyme or reason.  Most likely these are musculoskeletal symptoms or could be related to her GI issues.  Not reproducible on exam, but does sound musculoskeletal.  For true screening of potential cardiovascular risk, coronary calcium score is probably the best option.  We talked about pathophysiology of coronary disease and how calcification of the coronaries which show underlying CAD.  With her family history, risk factor modification will be the most important concept, and a lot of this could be addressed more aggressively if her coronary calcium score is high.  Her blood pressure is well controlled on no medications, and she is reluctant to take medications for his cholesterol as well.  Plan: Check CORONARY CALCIUM SCORE (CT CARDIAC SCORING) for baseline cardiovascular risk.  Try to obtain most recent lipid levels for follow-up.  Problem List Items Addressed This Visit    Family history of premature CAD   Relevant Orders   EKG 12-Lead (Completed)   CT CARDIAC SCORING (Completed)   Chest pain, precordial - Primary   Relevant Orders   EKG 12-Lead (Completed)   CT CARDIAC SCORING (Completed)       COVID-19 Education: The signs and symptoms of COVID-19 were discussed with the patient and how to seek care for testing (follow up with PCP or arrange E-visit).   The importance of social distancing and COVID-19 vaccination was discussed today.  I spent a total of 3 2minutes with the patient. >  50% of the time was spent in direct patient consultation.  Additional time spent with chart review  / charting (studies, outside notes, etc): 9 Total Time: 41min   Current medicines are reviewed at length with the patient today.  (+/- concerns) N/A  Notice: This dictation was prepared with Dragon dictation along with smaller phrase technology. Any transcriptional errors that result from this process are unintentional and may not be corrected upon review.  Patient Instructions /  Medication Changes & Studies & Tests Ordered   Patient Instructions  Medication Instructions:   not needed *If you need a refill on your cardiac medications before your next appointment, please call your pharmacy*   Lab Work: Not needed   Testing/Procedures:  CT coronary calcium score. This test is done at  [address]. This is $150 out of pocket.   Coronary CalciumScan A coronary calcium scan is an imaging test used to look for deposits of calcium and other fatty materials (plaques) in the inner lining of the blood vessels of the heart (coronary arteries). These deposits of calcium and plaques can partly clog and narrow the coronary arteries without producing any symptoms or warning signs. This puts a person at risk for a heart attack. This test can detect these deposits before symptoms develop. Tell a health care provider about:  Any allergies you have.  All medicines you are taking, including vitamins, herbs, eye drops, creams, and over-the-counter medicines.  Any problems you or family members have had with anesthetic medicines.  Any blood disorders you have.  Any surgeries you have had.  Any medical conditions you have.  Whether you are pregnant or may be pregnant. What are the risks? Generally, this is a safe procedure. However, problems may occur, including:  Harm to a pregnant woman and her unborn baby. This test involves the use of radiation. Radiation exposure can be dangerous to a pregnant woman and her unborn baby. If you are pregnant, you generally should not have this procedure done.  Slight increase in the risk of cancer. This is because of the radiation involved in the test. What happens before the procedure? No preparation is needed for this procedure. What happens during the procedure?  You will undress and remove any jewelry around your neck or chest.  You will put on a hospital gown.  Sticky electrodes will be placed on your chest.  The electrodes will be connected to an electrocardiogram (ECG) machine to record a tracing of the electrical activity of your heart.  A CT scanner will take pictures of your heart. During this time, you will be asked to lie still and hold your breath for 2-3 seconds while a picture of your heart is being taken. The procedure may vary among health care providers and hospitals. What happens after the procedure?  You can get dressed.  You can return to your normal activities.  It is up to you to get the results of your test. Ask your health care provider, or the department that is doing the test, when your results will be ready. Summary  A coronary calcium scan is an imaging test used to look for deposits of calcium and other fatty materials (plaques) in the inner lining of the blood vessels of the heart (coronary arteries).  Generally, this is a safe procedure. Tell your health care provider if you are pregnant or may be pregnant.  No preparation is needed for this procedure.  A CT scanner will take pictures of your heart.  You can return to your normal activities after the scan is done. This information is not intended to replace advice given to you by your health care provider. Make sure you discuss any questions you have with your health care provider. Document Released: 11/07/2007 Document Revised: 03/30/2016 Document Reviewed: 03/30/2016 Elsevier Interactive Patient Education  2017 [employer].    Follow-Up: At [facility], you and your health needs are our priority.  As part of our continuing mission to provide you with exceptional heart care, we have created designated Provider Care Teams.  These Care Teams include your primary Cardiologist (physician) and Advanced Practice Providers (APPs -  Physician Assistants and Nurse Practitioners) who all work together to provide you with the care you need, when you need it.  Your next appointment:   12 month(s)  The format for  your next appointment:   In Person  Provider:   Bryan Lemma, MD   Other Instructions     Studies Ordered:   Orders Placed This Encounter  Procedures  . CT CARDIAC SCORING  . EKG 12-Lead     Bryan Lemma, M.D., M.S. Interventional Cardiologist   Pager # [phone] Phone # [phone] [address] [place], [place] [address]   Thank you for choosing Heartcare at [facility]!!

## 2019-11-20 NOTE — Patient Instructions (Signed)
Medication Instructions:   not needed *If you need a refill on your cardiac medications before your next appointment, please call your pharmacy*   Lab Work: Not needed   Testing/Procedures:  CT coronary calcium score. This test is done at [address] [address]. This is $150 out of pocket.   Coronary CalciumScan A coronary calcium scan is an imaging test used to look for deposits of calcium and other fatty materials (plaques) in the inner lining of the blood vessels of the heart (coronary arteries). These deposits of calcium and plaques can partly clog and narrow the coronary arteries without producing any symptoms or warning signs. This puts a person at risk for a heart attack. This test can detect these deposits before symptoms develop. Tell a health care provider about:  Any allergies you have.  All medicines you are taking, including vitamins, herbs, eye drops, creams, and over-the-counter medicines.  Any problems you or family members have had with anesthetic medicines.  Any blood disorders you have.  Any surgeries you have had.  Any medical conditions you have.  Whether you are pregnant or may be pregnant. What are the risks? Generally, this is a safe procedure. However, problems may occur, including:  Harm to a pregnant woman and her unborn baby. This test involves the use of radiation. Radiation exposure can be dangerous to a pregnant woman and her unborn baby. If you are pregnant, you generally should not have this procedure done.  Slight increase in the risk of cancer. This is because of the radiation involved in the test. What happens before the procedure? No preparation is needed for this procedure. What happens during the procedure?  You will undress and remove any jewelry around your neck or chest.  You will put on a hospital gown.  Sticky electrodes will be placed on your chest. The electrodes will be connected to an electrocardiogram (ECG) machine  to record a tracing of the electrical activity of your heart.  A CT scanner will take pictures of your heart. During this time, you will be asked to lie still and hold your breath for 2-3 seconds while a picture of your heart is being taken. The procedure may vary among health care providers and hospitals. What happens after the procedure?  You can get dressed.  You can return to your normal activities.  It is up to you to get the results of your test. Ask your health care provider, or the department that is doing the test, when your results will be ready. Summary  A coronary calcium scan is an imaging test used to look for deposits of calcium and other fatty materials (plaques) in the inner lining of the blood vessels of the heart (coronary arteries).  Generally, this is a safe procedure. Tell your health care provider if you are pregnant or may be pregnant.  No preparation is needed for this procedure.  A CT scanner will take pictures of your heart.  You can return to your normal activities after the scan is done. This information is not intended to replace advice given to you by your health care provider. Make sure you discuss any questions you have with your health care provider. Document Released: 11/07/2007 Document Revised: 03/30/2016 Document Reviewed: 03/30/2016 Elsevier Interactive Patient Education  2017 [employer].    Follow-Up: At [facility] ([place]), you and your health needs are our priority.  As part of our continuing mission to provide you with exceptional heart care, we have created  designated Provider Care Teams.  These Care Teams include your primary Cardiologist (physician) and Advanced Practice Providers (APPs -  Physician Assistants and Nurse Practitioners) who all work together to provide you with the care you need, when you need it.    Your next appointment:   12 month(s)  The format for your next appointment:   In Person  Provider:   Glenetta Hew,  MD   Other Instructions

## 2019-11-21 ENCOUNTER — Ambulatory Visit (INDEPENDENT_AMBULATORY_CARE_PROVIDER_SITE_OTHER)
Admission: RE | Admit: 2019-11-21 | Discharge: 2019-11-21 | Disposition: A | Discharge status: Home / Self Care | Payer: Self-pay | Source: Ambulatory Visit | Attending: Cardiology | Admitting: Cardiology

## 2019-11-21 DIAGNOSIS — R072 Precordial pain: Secondary | ICD-10-CM

## 2019-11-21 DIAGNOSIS — Z8249 Family history of ischemic heart disease and other diseases of the circulatory system: Secondary | ICD-10-CM

## 2019-11-22 ENCOUNTER — Encounter: Payer: Self-pay | Admitting: Cardiology

## 2019-11-24 ENCOUNTER — Ambulatory Visit: Payer: BC Managed Care – PPO | Admitting: Neurology

## 2019-12-12 ENCOUNTER — Ambulatory Visit: Payer: BC Managed Care – PPO | Admitting: Family Medicine

## 2020-01-22 NOTE — Progress Notes (Signed)
[name] [address] [place] [address] Phone: [phone] Subjective:   I Dana [name] am serving as a Neurosurgeon for Dr. Antoine Primas.  This visit occurred during the SARS-CoV-2 public health emergency.  Safety protocols were in place, including screening questions prior to the visit, additional usage of staff PPE, and extensive cleaning of exam room while observing appropriate contact time as indicated for disinfecting solutions.   I'm seeing this patient by the request  of:  Tracey Harries, MD  CC: Foot pain, knee pain, upper back and neck pain  [id number]  Dana [name] is a 59 y.o. female coming in with complaint of back and neck pain. OMT 11/02/2019. Patient states the right knee is doing well. States while playing pickle ball she slid. Knee has been bothering her for 2 weeks. States she also has something at the ball of her foot in the left foot. Back and neck doing well.           Reviewed prior external information including notes and imaging from previsou exam, outside providers and external EMR if available.   As well as notes that were available from care everywhere and other healthcare systems.  Past medical history, social, surgical and family history all reviewed in electronic medical record.  No pertanent information unless stated regarding to the chief complaint.   Past Medical History:  Diagnosis Date  . Abnormality of gait 07/18/2015  . Adhesive capsulitis of left shoulder 12/23/2017   Documented by her PCP and treated by Sharen Hones  . AR (allergic rhinitis) 11/08/2011  . BPPV (benign paroxysmal positional vertigo) 07/31/2015  . Chest pain, precordial 09/30/2017  . Disturbance in sleep behavior 12/30/2017  . DOE (dyspnea on exertion) 09/29/2017  . Family history of early CAD 09/29/2017  . Headache   . Insomnia 11/08/2011  . Leg length discrepancy 12/30/2017  . Menopause 11/08/2011   2009   . Migraine with aura and without  status migrainosus, not intractable 07/31/2015  . Nasal septal deviation 11/08/2011  . Nonallopathic lesion of cervical region 11/03/2019  . Sternocleidomastoid muscle tenderness 12/30/2017  . Vertigo     Allergies  Allergen Reactions  . Prednisone     Feels like skin crawls     Review of Systems:  No headache, visual changes, nausea, vomiting, diarrhea, constipation, dizziness, abdominal pain, skin rash, fevers, chills, night sweats, weight loss, swollen lymph nodes, body aches, joint swelling, chest pain, shortness of breath, mood changes. POSITIVE muscle aches  Objective  Blood pressure 130/80, pulse 62, height 5' 3.5" (1.613 m), weight 139 lb (63 kg), SpO2 93 %.   General: No apparent distress alert and oriented x3 mood and affect normal, dressed appropriately.  HEENT: Pupils equal, extraocular movements intact  Respiratory: Patient's speak in full sentences and does not appear short of breath  Cardiovascular: No lower extremity edema, non tender, no erythema  Neuro: Cranial nerves II through XII are intact, neurovascularly intact in all extremities with 2+ DTRs and 2+ pulses.  Gait normal with good balance and coordination.  MSK: Right knee shows more of a patellofemoral with some lateral tracking noted.  Positive patellar grind.  Patient has full range of motion otherwise.  Negative McMurray's.  No significant malalignment other than the kneecap noted. Left foot exam shows the patient does have breakdown of the transverse arch noted.  Some bunion and bunionette formation starting laterally.  Patient does have callus formation noted on the plantar aspect. Back -neck  exam still shows some mild loss of lordosis, some tenderness to palpation in the paraspinal musculature.       Assessment and Plan:    Nonallopathic problems  Decision today to treat with OMT was based on Physical Exam  After verbal consent patient was treated with HVLA, ME, FPR techniques in cervical, rib,  thoracic, lumbar, and sacral  areas  Patient tolerated the procedure well with improvement in symptoms  Patient given exercises, stretches and lifestyle modifications  See medications in patient instructions if given  Patient will follow up in 4-8 weeks      The above documentation has been reviewed and is accurate and complete Judi Saa, DO       Note: This dictation was prepared with Dragon dictation along with smaller phrase technology. Any transcriptional errors that result from this process are unintentional.

## 2020-01-23 ENCOUNTER — Ambulatory Visit: Payer: BC Managed Care – PPO | Admitting: Family Medicine

## 2020-01-23 ENCOUNTER — Encounter: Payer: Self-pay | Admitting: Family Medicine

## 2020-01-23 ENCOUNTER — Other Ambulatory Visit: Payer: Self-pay

## 2020-01-23 DIAGNOSIS — R269 Unspecified abnormalities of gait and mobility: Secondary | ICD-10-CM | POA: Diagnosis not present

## 2020-01-23 DIAGNOSIS — M542 Cervicalgia: Secondary | ICD-10-CM | POA: Diagnosis not present

## 2020-01-23 DIAGNOSIS — M222X1 Patellofemoral disorders, right knee: Secondary | ICD-10-CM | POA: Insufficient documentation

## 2020-01-23 NOTE — Assessment & Plan Note (Signed)
Transverse arch breakdown, discussed which activities to doing which wants to avoid.  Patient is to increase activity slowly.  Discussed icing regimen and home exercise, follow-up again in 4 to 8 weeks.

## 2020-01-23 NOTE — Patient Instructions (Addendum)
Good to see you Exercise 3 times a week Spenco orthotics "total support" See me again in 2 months

## 2020-01-23 NOTE — Assessment & Plan Note (Signed)
Stable noted.  Discussed home exercise, which activities to do which wants to avoid.  Increase activity slowly.  Did not do manipulation today but can consider again at follow-up in 2 months

## 2020-01-23 NOTE — Assessment & Plan Note (Signed)
Possible subluxation.  Discussed which activities to do which wants to avoid.  Increase in activity slowly.  Discussed the potential for different bracing including a Tru pull lite but patient did not feel that they were comfortable.  Patient will start with a over-the-counter compression sleeve and see how patient responds with some home exercises.

## 2020-03-13 NOTE — Progress Notes (Signed)
[name] [address] [place] [address] Phone: [phone] Subjective:    I'm seeing this patient by the request  of:  Tracey Harries, MD  CC: Back pain follow-up  [id number]   01/23/2020 Possible subluxation.  Discussed which activities to do which wants to avoid.  Increase in activity slowly.  Discussed the potential for different bracing including a Tru pull lite but patient did not feel that they were comfortable.  Patient will start with a over-the-counter compression sleeve and see how patient responds with some home exercises.  Stable noted.  Discussed home exercise, which activities to do which wants to avoid.  Increase activity slowly.  Did not do manipulation today but can consider again at follow-up in 2 months  Transverse arch breakdown, discussed which activities to doing which wants to avoid.  Patient is to increase activity slowly.  Discussed icing regimen and home exercise, follow-up again in 4 to 8 weeks.  Update 03/14/2020 Dana [name] is a 59 y.o. female coming in with complaint of right knee pain. Patient states 5 weeks ago she flared her back pain up and stopped pickle ball for awhile and seen Ellamae Sia to help with pain. Patient states one day she ran up some stairs and her low back started hurting very bad. Low back pain that radiates to hips and also having some groin pain. Had x-rays last Friday. Patient saw her PCP was given Meloxicam and flexeril but has not started them. Is walking daily to stay active.  Patient was sent avoid medications if possible.  Wants know if continue to stay active.    Past Medical History:  Diagnosis Date  . Abnormality of gait 07/18/2015  . Adhesive capsulitis of left shoulder 12/23/2017   Documented by her PCP and treated by Sharen Hones  . AR (allergic rhinitis) 11/08/2011  . BPPV (benign paroxysmal positional vertigo) 07/31/2015  . Chest pain, precordial 09/30/2017  . Disturbance in  sleep behavior 12/30/2017  . DOE (dyspnea on exertion) 09/29/2017  . Family history of early CAD 09/29/2017  . Headache   . Insomnia 11/08/2011  . Leg length discrepancy 12/30/2017  . Menopause 11/08/2011   2009   . Migraine with aura and without status migrainosus, not intractable 07/31/2015  . Nasal septal deviation 11/08/2011  . Nonallopathic lesion of cervical region 11/03/2019  . Sternocleidomastoid muscle tenderness 12/30/2017  . Vertigo    History reviewed. No pertinent surgical history. Social History   Socioeconomic History  . Marital status: Married    Spouse name: Not on file  . Number of children: Not on file  . Years of education: Not on file  . Highest education level: Not on file  Occupational History  . Not on file  Tobacco Use  . Smoking status: Former Games developer  . Smokeless tobacco: Never Used  . Tobacco comment: in highschool  Vaping Use  . Vaping Use: Never used  Substance and Sexual Activity  . Alcohol use: Yes    Comment: occasionally  . Drug use: Never  . Sexual activity: Not on file  Other Topics Concern  . Not on file  Social History Narrative   Left handed   Lives with husband two story home   Social Determinants of Health   Financial Resource Strain:   . Difficulty of Paying Living Expenses: Not on file  Food Insecurity:   . Worried About Programme researcher, broadcasting/film/video in the Last Year: Not on file  . Ran  Out of Food in the Last Year: Not on file  Transportation Needs:   . Lack of Transportation (Medical): Not on file  . Lack of Transportation (Non-Medical): Not on file  Physical Activity:   . Days of Exercise per Week: Not on file  . Minutes of Exercise per Session: Not on file  Stress:   . Feeling of Stress : Not on file  Social Connections:   . Frequency of Communication with Friends and Family: Not on file  . Frequency of Social Gatherings with Friends and Family: Not on file  . Attends Religious Services: Not on file  . Active Member of Clubs or  Organizations: Not on file  . Attends Banker Meetings: Not on file  . Marital Status: Not on file   Allergies  Allergen Reactions  . Prednisone     Feels like skin crawls   Family History  Problem Relation Age of Onset  . Hypertension Mother   . Hypertension Father   . Cancer Father   . Diabetes Father   . Coronary artery disease Father        History of CABG  . Parkinsonism Paternal Grandfather   . High Cholesterol Brother 42       Current Outpatient Medications (Analgesics):  Marland Kitchen  SUMAtriptan (IMITREX) 100 MG tablet, Take 1 tablet earliest onset of migraine.  May repeat once in 2 hours if headache persists or recurs.  Do not exceed 2 tablets in 24 hours .  meloxicam (MOBIC) 7.5 MG tablet, Take 7.5 mg by mouth 2 (two) times daily. (Patient not taking: Reported on 03/14/2020)   Current Outpatient Medications (Other):  .  dicyclomine (BENTYL) 20 MG tablet, Take 1 tablet (20 mg total) by mouth every 12 (twelve) hours as needed (for abdominal pain/cramping). Marland Kitchen  esomeprazole (NEXIUM) 40 MG capsule, Take by mouth. .  ondansetron (ZOFRAN ODT) 4 MG disintegrating tablet, Take 1 tablet (4 mg total) by mouth every 6 (six) hours as needed. .  pantoprazole (PROTONIX) 40 MG tablet, Take 1 tablet (40 mg total) by mouth daily as needed (abdominal pain, bloating, reflux). .  Vitamin D, Ergocalciferol, (DRISDOL) 1.25 MG (50000 UNIT) CAPS capsule, Take 1 capsule (50,000 Units total) by mouth every 7 (seven) days. .  cyclobenzaprine (FLEXERIL) 5 MG tablet, Take 5 mg by mouth 3 (three) times daily as needed. (Patient not taking: Reported on 03/14/2020)   Reviewed prior external information including notes and imaging from  primary care provider As well as notes that were available from care everywhere and other healthcare systems.  Past medical history, social, surgical and family history all reviewed in electronic medical record.  No pertanent information unless stated regarding  to the chief complaint.   Review of Systems:  No headache, visual changes, nausea, vomiting, diarrhea, constipation, dizziness, abdominal pain, skin rash, fevers, chills, night sweats, weight loss, swollen lymph nodes, body aches, joint swelling, chest pain, shortness of breath, mood changes. POSITIVE muscle aches  Objective  Blood pressure 108/78, pulse 86, height 5' 3.5" (1.613 m), weight 137 lb (62.1 kg), SpO2 99 %.   General: No apparent distress alert and oriented x3 mood and affect normal, dressed appropriately.  HEENT: Pupils equal, extraocular movements intact  Respiratory: Patient's speak in full sentences and does not appear short of breath  Cardiovascular: No lower extremity edema, non tender, no erythema  Neuro: Cranial nerves II through XII are intact, neurovascularly intact in all extremities with 2+ DTRs and 2+ pulses.  Gait normal  with good balance and coordination.  MSK: Right knee exam still shows the patella with some lateral translation noted.  Mild crepitus noted.  Good stability of the knee otherwise  Low back exam does have some loss of lordosis.  Some tightness with Pearlean Brownie.  Negative straight leg test.  Mild tightness on the right hip.  Seems to be more in the groin area.  Osteopathic findings  C6 flexed rotated and side bent left T7 extended rotated and side bent left L2 flexed rotated and side bent right Sacrum right on right  Patient did have x-rays previously from an outside facility.  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    Impression and Recommendations:     The above documentation has been reviewed and is accurate and complete Judi Saa, DO

## 2020-03-14 ENCOUNTER — Ambulatory Visit: Payer: BC Managed Care – PPO | Admitting: Family Medicine

## 2020-03-14 ENCOUNTER — Other Ambulatory Visit: Payer: Self-pay

## 2020-03-14 ENCOUNTER — Encounter: Payer: Self-pay | Admitting: Family Medicine

## 2020-03-14 VITALS — BP 108/78 | HR 86 | Ht 63.5 in | Wt 137.0 lb

## 2020-03-14 DIAGNOSIS — G8929 Other chronic pain: Secondary | ICD-10-CM | POA: Diagnosis not present

## 2020-03-14 DIAGNOSIS — M999 Biomechanical lesion, unspecified: Secondary | ICD-10-CM

## 2020-03-14 DIAGNOSIS — M545 Low back pain, unspecified: Secondary | ICD-10-CM

## 2020-03-14 NOTE — Patient Instructions (Signed)
Exercises Ok to be active and monitor hip If back tightens up again can consider MRI See me in 5 weeks for manipulation

## 2020-03-14 NOTE — Assessment & Plan Note (Signed)
Patient is doing relatively well some mild tightness.  Low back x-rays were fairly unremarkable except for some mild osteoarthritic changes.  Discussed that if it continues we can always consider an MRI but do not think it would actually change medical management.  Okay to continue with the physical therapy and the manipulation that they do there.  Follow-up with me again in 4 to 8 weeks.

## 2020-03-15 ENCOUNTER — Encounter: Payer: Self-pay | Admitting: Family Medicine

## 2020-03-25 ENCOUNTER — Other Ambulatory Visit: Payer: Self-pay

## 2020-03-25 ENCOUNTER — Encounter: Payer: Self-pay | Admitting: Family Medicine

## 2020-03-25 DIAGNOSIS — M545 Low back pain, unspecified: Secondary | ICD-10-CM

## 2020-03-26 ENCOUNTER — Ambulatory Visit: Payer: BC Managed Care – PPO | Admitting: Family Medicine

## 2020-03-30 ENCOUNTER — Other Ambulatory Visit: Payer: Self-pay

## 2020-03-30 ENCOUNTER — Ambulatory Visit (INDEPENDENT_AMBULATORY_CARE_PROVIDER_SITE_OTHER): Payer: BC Managed Care – PPO

## 2020-03-30 DIAGNOSIS — M5136 Other intervertebral disc degeneration, lumbar region: Secondary | ICD-10-CM | POA: Diagnosis not present

## 2020-03-30 DIAGNOSIS — G8929 Other chronic pain: Secondary | ICD-10-CM

## 2020-03-30 DIAGNOSIS — M5126 Other intervertebral disc displacement, lumbar region: Secondary | ICD-10-CM | POA: Diagnosis not present

## 2020-03-31 ENCOUNTER — Emergency Department (HOSPITAL_COMMUNITY)
Admission: EM | Admit: 2020-03-31 | Discharge: 2020-03-31 | Disposition: A | Discharge status: Home / Self Care | Payer: BC Managed Care – PPO | Attending: Emergency Medicine | Admitting: Emergency Medicine

## 2020-03-31 ENCOUNTER — Other Ambulatory Visit: Payer: Self-pay

## 2020-03-31 ENCOUNTER — Emergency Department (HOSPITAL_COMMUNITY): Payer: BC Managed Care – PPO

## 2020-03-31 ENCOUNTER — Encounter (HOSPITAL_COMMUNITY): Payer: Self-pay | Admitting: Emergency Medicine

## 2020-03-31 DIAGNOSIS — M545 Low back pain, unspecified: Secondary | ICD-10-CM | POA: Diagnosis not present

## 2020-03-31 DIAGNOSIS — R10814 Left lower quadrant abdominal tenderness: Secondary | ICD-10-CM | POA: Insufficient documentation

## 2020-03-31 DIAGNOSIS — R10817 Generalized abdominal tenderness: Secondary | ICD-10-CM | POA: Insufficient documentation

## 2020-03-31 DIAGNOSIS — R10816 Epigastric abdominal tenderness: Secondary | ICD-10-CM | POA: Diagnosis not present

## 2020-03-31 DIAGNOSIS — Z87891 Personal history of nicotine dependence: Secondary | ICD-10-CM | POA: Diagnosis not present

## 2020-03-31 DIAGNOSIS — R1012 Left upper quadrant pain: Secondary | ICD-10-CM | POA: Insufficient documentation

## 2020-03-31 DIAGNOSIS — G8929 Other chronic pain: Secondary | ICD-10-CM | POA: Insufficient documentation

## 2020-03-31 LAB — URINALYSIS, ROUTINE W REFLEX MICROSCOPIC
Bilirubin Urine: NEGATIVE
Glucose, UA: NEGATIVE mg/dL
Hgb urine dipstick: NEGATIVE
Ketones, ur: NEGATIVE mg/dL
Leukocytes,Ua: NEGATIVE
Nitrite: NEGATIVE
Protein, ur: NEGATIVE mg/dL
Specific Gravity, Urine: 1.012 (ref 1.005–1.030)
pH: 7 (ref 5.0–8.0)

## 2020-03-31 LAB — COMPREHENSIVE METABOLIC PANEL
ALT: 19 U/L (ref 0–44)
AST: 22 U/L (ref 15–41)
Albumin: 4.4 g/dL (ref 3.5–5.0)
Alkaline Phosphatase: 60 U/L (ref 38–126)
Anion gap: 11 (ref 5–15)
BUN: 9 mg/dL (ref 6–20)
CO2: 25 mmol/L (ref 22–32)
Calcium: 10.2 mg/dL (ref 8.9–10.3)
Chloride: 100 mmol/L (ref 98–111)
Creatinine, Ser: 0.81 mg/dL (ref 0.44–1.00)
GFR, Estimated: >60 mL/min (ref >60)
Glucose, Bld: 122 mg/dL — ABNORMAL HIGH (ref 70–99)
Potassium: 3.8 mmol/L (ref 3.5–5.1)
Sodium: 136 mmol/L (ref 135–145)
Total Bilirubin: 0.6 mg/dL (ref 0.3–1.2)
Total Protein: 7.6 g/dL (ref 6.5–8.1)

## 2020-03-31 LAB — CBC
HCT: 42.6 % (ref 36.0–46.0)
Hemoglobin: 14.7 g/dL (ref 12.0–15.0)
MCH: 30.9 pg (ref 26.0–34.0)
MCHC: 34.5 g/dL (ref 30.0–36.0)
MCV: 89.7 fL (ref 80.0–100.0)
Platelets: 240 10*3/uL (ref 150–400)
RBC: 4.75 MIL/uL (ref 3.87–5.11)
RDW: 12 % (ref 11.5–15.5)
WBC: 9.2 10*3/uL (ref 4.0–10.5)
nRBC: 0 % (ref 0.0–0.2)

## 2020-03-31 LAB — LIPASE, BLOOD: Lipase: 28 U/L (ref 11–51)

## 2020-03-31 MED ORDER — ONDANSETRON 4 MG PO TBDP
4.0000 mg | ORAL_TABLET | Freq: Once | ORAL | Status: AC
  Administered 2020-03-31: 4 mg via ORAL
  Filled 2020-03-31: qty 1

## 2020-03-31 MED ORDER — LIDOCAINE VISCOUS HCL 2 % MT SOLN
15.0000 mL | Freq: Once | OROMUCOSAL | Status: AC
  Administered 2020-03-31: 15 mL via ORAL
  Filled 2020-03-31: qty 15

## 2020-03-31 MED ORDER — HYDROCODONE-ACETAMINOPHEN 5-325 MG PO TABS
1.0000 | ORAL_TABLET | Freq: Once | ORAL | Status: AC
  Administered 2020-03-31: 1 via ORAL
  Filled 2020-03-31: qty 1

## 2020-03-31 MED ORDER — SUCRALFATE 1 G PO TABS: 1.0000 g | ORAL_TABLET | Freq: Three times a day (TID) | ORAL | 0 refills | Status: DC

## 2020-03-31 MED ORDER — IOHEXOL 300 MG/ML  SOLN
100.0000 mL | Freq: Once | INTRAMUSCULAR | Status: AC | PRN
  Administered 2020-03-31: 100 mL via INTRAVENOUS

## 2020-03-31 MED ORDER — PANTOPRAZOLE SODIUM 40 MG IV SOLR
40.0000 mg | Freq: Once | INTRAVENOUS | Status: AC
  Administered 2020-03-31: 40 mg via INTRAVENOUS
  Filled 2020-03-31: qty 40

## 2020-03-31 MED ORDER — ALUM & MAG HYDROXIDE-SIMETH 200-200-20 MG/5ML PO SUSP
30.0000 mL | Freq: Once | ORAL | Status: AC
  Administered 2020-03-31: 30 mL via ORAL
  Filled 2020-03-31: qty 30

## 2020-03-31 MED ORDER — PANTOPRAZOLE SODIUM 40 MG PO TBEC: 40.0000 mg | DELAYED_RELEASE_TABLET | Freq: Every day | ORAL | 0 refills | Status: DC | PRN

## 2020-03-31 NOTE — ED Triage Notes (Signed)
Pt reports mild generalized abd pain that started after eating out (shrimp scampi) for dinner last night.  States pain also mild this morning. Ate a banana and oatmeal this morning.  Now having intermittent severe upper abd pain that is worse on the left.  Reports nausea and feeling bloated.  States she is also having chronic lower back pain that she just had an MRI for this week.  Took Mylanta today without relief.

## 2020-03-31 NOTE — ED Provider Notes (Signed)
[facility]   CSN: [id number] Arrival date & time: 03/31/20  1420     History Chief Complaint  Patient presents with  . Abdominal Pain    Dana [name] is a 59 y.o. female with past medical history of GERD, chronic abdominal pain, chronic back pain, presenting to the emergency department with epigastric and left upper quadrant abdominal pain that began last night.  Patient states last night she ate shrimp scampi and had reflux soon after.  She treated with Mylanta and felt better overnight.  She woke up this morning and continued to have left upper quadrant abdominal pain with belching and bloating.  Pain comes and goes, is worse with water and better with Mylanta.  She does not take any daily antacids.  No associated diarrhea,vomiting, fevers, urinary sx. No alcohol use or NSAIDs.  She has a GI specialist however has not followed in multiple years.  She states she has had extensive work-up of her gallbladder has been normal.  No history of ulcers or diverticulitis.  The history is provided by the patient.       Past Medical History:  Diagnosis Date  . Abnormality of gait 07/18/2015  . Adhesive capsulitis of left shoulder 12/23/2017   Documented by her PCP and treated by Sharen HonesJohn OHalloran  . AR (allergic rhinitis) 11/08/2011  . BPPV (benign paroxysmal positional vertigo) 07/31/2015  . Chest pain, precordial 09/30/2017  . Disturbance in sleep behavior 12/30/2017  . DOE (dyspnea on exertion) 09/29/2017  . Family history of early CAD 09/29/2017  . Headache   . Insomnia 11/08/2011  . Leg length discrepancy 12/30/2017  . Menopause 11/08/2011   2009   . Migraine with aura and without status migrainosus, not intractable 07/31/2015  . Nasal septal deviation 11/08/2011  . Nonallopathic lesion of cervical region 11/03/2019  . Sternocleidomastoid muscle tenderness 12/30/2017  . Vertigo     Patient Active Problem List   Diagnosis Date Noted  . Low back  pain 03/14/2020  . Patellofemoral syndrome of right knee 01/23/2020  . Nonallopathic lesion of cervical region 11/03/2019  . Sternocleidomastoid muscle tenderness 12/30/2017  . Leg length discrepancy 12/30/2017  . Disturbance in sleep behavior 12/30/2017  . Neck pain 12/23/2017  . Adhesive capsulitis of left shoulder 12/23/2017  . Chest pain, precordial 09/30/2017  . Pre-op testing 09/30/2017  . DOE (dyspnea on exertion) 09/29/2017  . Family history of premature CAD 09/29/2017  . Pain of left heel 06/02/2016  . BPPV (benign paroxysmal positional vertigo) 07/31/2015  . Migraine with aura and without status migrainosus, not intractable 07/31/2015  . Abnormality of gait 07/18/2015  . Pain in joint, ankle and foot 06/20/2013  . Menopause 11/08/2011  . Nasal septal deviation 11/08/2011  . Insomnia 11/08/2011  . AR (allergic rhinitis) 11/08/2011    History reviewed. No pertinent surgical history.   OB History   No obstetric history on file.     Family History  Problem Relation Age of Onset  . Hypertension Mother   . Hypertension Father   . Cancer Father   . Diabetes Father   . Coronary artery disease Father        History of CABG  . Parkinsonism Paternal Grandfather   . High Cholesterol Brother [phone]    Social History   Tobacco Use  . Smoking status: Former Games developermoker  . Smokeless tobacco: Never Used  . Tobacco comment: in highschool  Vaping Use  . Vaping Use: Never used  Substance Use Topics  . Alcohol use: Yes    Comment: occasionally  . Drug use: Never    Home Medications Prior to Admission medications   Medication Sig Start Date End Date Taking? Authorizing Provider  cyclobenzaprine (FLEXERIL) 5 MG tablet Take 5 mg by mouth 3 (three) times daily as needed. Patient not taking: Reported on 03/14/2020 03/01/20   [provider]  dicyclomine (BENTYL) 20 MG tablet Take 1 tablet (20 mg total) by mouth every 12 (twelve) hours as needed (for abdominal  pain/cramping). 09/19/18   Antony Madura, PA-C  esomeprazole (NEXIUM) 40 MG capsule Take by mouth. 11/02/19   [provider]  meloxicam (MOBIC) 7.5 MG tablet Take 7.5 mg by mouth 2 (two) times daily. Patient not taking: Reported on 03/14/2020 03/01/20   [provider]  ondansetron (ZOFRAN ODT) 4 MG disintegrating tablet Take 1 tablet (4 mg total) by mouth every 6 (six) hours as needed. 04/22/18   Ward, Layla Maw, DO  pantoprazole (PROTONIX) 40 MG tablet Take 1 tablet (40 mg total) by mouth daily as needed (abdominal pain, bloating, reflux). 03/31/20   Robert Sunga, [place] N, PA-C  sucralfate (CARAFATE) 1 g tablet Take 1 tablet (1 g total) by mouth 4 (four) times daily -  with meals and at bedtime. 03/31/20   Bland Rudzinski, [place] N, PA-C  SUMAtriptan (IMITREX) 100 MG tablet Take 1 tablet earliest onset of migraine.  May repeat once in 2 hours if headache persists or recurs.  Do not exceed 2 tablets in 24 hours 10/31/19   Drema Dallas, DO  Vitamin D, Ergocalciferol, (DRISDOL) 1.25 MG (50000 UNIT) CAPS capsule Take 1 capsule (50,000 Units total) by mouth every 7 (seven) days. 11/02/19   Judi Saa, DO    Allergies    Prednisone  Review of Systems   Review of Systems  Gastrointestinal: Positive for abdominal pain.  All other systems reviewed and are negative.   Physical Exam Updated Vital Signs BP 113/69   Pulse 75   Temp 98.2 F (36.8 C) (Oral)   Resp 17   SpO2 95%   Physical Exam Vitals and nursing note reviewed.  Constitutional:      General: She is not in acute distress.    Appearance: She is well-developed. She is not ill-appearing.  HENT:     Head: Normocephalic and atraumatic.  Eyes:     Conjunctiva/sclera: Conjunctivae normal.  Cardiovascular:     Rate and Rhythm: Normal rate and regular rhythm.  Pulmonary:     Effort: Pulmonary effort is normal. No respiratory distress.     Breath sounds: Normal breath sounds.  Abdominal:     General: Abdomen is flat. Bowel  sounds are normal.     Palpations: Abdomen is soft.     Tenderness: There is generalized abdominal tenderness (Tenderness is generalized, worse in the epigastric region, left upper quadrant left lower quadrant). There is rebound. There is no guarding.  Skin:    General: Skin is warm.  Neurological:     Mental Status: She is alert.  Psychiatric:        Behavior: Behavior normal.     ED Results / Procedures / Treatments   Labs (all labs ordered are listed, but only abnormal results are displayed) Labs Reviewed  COMPREHENSIVE METABOLIC PANEL - Abnormal; Notable for the following components:      Result Value   Glucose, Bld 122 (*)    All other components within normal limits  LIPASE, BLOOD  CBC  URINALYSIS, ROUTINE W REFLEX MICROSCOPIC    EKG EKG Interpretation  Date/Time:  Sunday March 31 2020 14:29:43 EST Ventricular Rate:  88 PR Interval:  116 QRS Duration: 130 QT Interval:  394 QTC Calculation: 476 R Axis:   -2 Text Interpretation: Normal sinus rhythm Non-specific intra-ventricular conduction block Minimal voltage criteria for LVH, may be normal variant ( Cornell product ) Possible Inferior infarct , age undetermined Cannot rule out Anterior infarct , age undetermined Abnormal ECG No significant change since last tracing Confirmed by Melene Plan [phone]) on 03/31/2020 3:43:55 PM   Radiology MR Lumbar Spine Wo Contrast  Result Date: 03/31/2020 CLINICAL DATA:  Low back pain, greater than 6 weeks lumbar spine pain. Additional history provided by technologist: Patient reports low back pain mostly right side worsening for 7 weeks. EXAM: MRI LUMBAR SPINE WITHOUT CONTRAST TECHNIQUE: Multiplanar, multisequence MR imaging of the lumbar spine was performed. No intravenous contrast was administered. COMPARISON:  Lumbar spine MRI 03/23/2005. Lumbar spine radiographs 08/22/2005. FINDINGS: Segmentation: Transitional lumbosacral anatomy is identified. For the purposes of this dictation  there are 5 lumbar vertebral bodies. The lowest well-formed intervertebral disc space is designated L5-S1. Left-sided assimilation joint at L5-S1. Alignment:  No significant spondylolisthesis. Vertebrae: Vertebral body height is maintained. Redemonstrated fusion of the T11-T12 vertebral bodies anteriorly, presumably congenital. Rudimentary disc space at this level posteriorly. Conus medullaris and cauda equina: Conus extends to the T12-L1 level. No signal abnormality within the visualized distal spinal cord. Paraspinal and other soft tissues: Incompletely assessed left renal cyst. Paraspinal soft tissues within normal limits. Disc levels: Unless otherwise stated, the level by level findings below have not significantly changed since prior MRI 03/23/2005. Progressive mild disc degeneration throughout the lumbar spine. T11-T12: Congenital non segmentation. No significant disc herniation or stenosis. T12-L1: No significant disc herniation or stenosis. L1-L2: No significant disc herniation or stenosis. L2-L3: No significant disc herniation or stenosis. L3-L4: Minimal disc bulge, new from the prior examination. Progressive mild facet arthrosis. No significant spinal canal or foraminal stenosis. L4-L5: Progressed from the prior examination, there is a shallow broad-based central disc protrusion at site of posterior annular fissure. Mild facet arthrosis/ligamentum flavum hypertrophy, progressed. The disc protrusion contributes to mild/moderate bilateral subarticular narrowing with crowding of the bilateral descending L5 nerve roots (series 6, image 35). Mild relative narrowing of the central canal. No significant foraminal stenosis. L5-S1: No significant disc herniation. Mild facet arthrosis, progressed. No significant spinal canal or foraminal stenosis. IMPRESSION: Spinal numbering as described with L5-S1 left-sided assimilation joint. Lumbar spondylosis as outlined and having slightly progressed at several levels since  the prior MRI of 03/23/2005. Findings are most notably as follows. At L4-L5, there is mild disc degeneration. Progressive shallow broad-based central disc protrusion at site of posterior annular fissure. Progressive mild facet arthrosis/ligamentum flavum hypertrophy. The disc protrusion contributes to mild/moderate bilateral subarticular narrowing with crowding of the descending L5 nerve roots. Correlate for L5 radiculopathy. Mild relative narrowing of the central canal. No significant spinal canal or foraminal stenosis at the remaining levels. 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 Electronically Signed   By: Jackey Loge DO   On: 03/31/2020 12:19   CT Abdomen Pelvis W Contrast  Result Date: 03/31/2020 CLINICAL DATA:  Mid generalized abdominal pain, nausea, bloated EXAM: CT ABDOMEN AND PELVIS WITH CONTRAST TECHNIQUE: Multidetector CT imaging of the abdomen and pelvis was performed using the standard protocol following bolus administration of intravenous contrast. CONTRAST:  OMNIPAQUE IOHEXOL 300 MG/ML  SOLN COMPARISON:  None. FINDINGS: Lower chest: No acute pleural or parenchymal lung disease. Hepatobiliary: No focal liver abnormality is seen. No gallstones, gallbladder wall thickening, or biliary dilatation. Pancreas: Unremarkable. No pancreatic ductal dilatation or surrounding inflammatory changes. Spleen: Normal in size without focal abnormality. Adrenals/Urinary Tract: Small peripelvic cyst lower pole left kidney. Otherwise the kidneys enhance normally and symmetrically. Bladder is unremarkable. The adrenals are normal. Stomach/Bowel: No bowel obstruction or ileus. No bowel wall thickening or inflammatory change. Normal appendix right mid abdomen. Vascular/Lymphatic: No significant vascular findings are present. No enlarged abdominal or pelvic lymph nodes. Reproductive: Uterus and bilateral adnexa are unremarkable. Other: No free fluid or free gas.  No abdominal wall  hernia. Musculoskeletal: No acute or destructive bony lesions. Reconstructed images demonstrate no additional findings. Congenital bony fusion of T11 and T12. IMPRESSION: 1. No acute intra-abdominal or intrapelvic process. Electronically Signed   By: Sharlet Salina M.D.   On: 03/31/2020 18:53    Procedures Procedures (including critical care time)  Medications Ordered in ED Medications  alum & mag hydroxide-simeth (MAALOX/MYLANTA) 200-200-20 MG/5ML suspension 30 mL (30 mLs Oral Given 03/31/20 1702)    And  lidocaine (XYLOCAINE) 2 % viscous mouth solution 15 mL (15 mLs Oral Given 03/31/20 1702)  pantoprazole (PROTONIX) injection 40 mg (40 mg Intravenous Given 03/31/20 1702)  iohexol (OMNIPAQUE) 300 MG/ML solution 100 mL (100 mLs Intravenous Contrast Given 03/31/20 1816)  HYDROcodone-acetaminophen (NORCO/VICODIN) 5-325 MG per tablet 1 tablet (1 tablet Oral Given 03/31/20 1948)  ondansetron (ZOFRAN-ODT) disintegrating tablet 4 mg (4 mg Oral Given 03/31/20 1948)    ED Course  I have reviewed the triage vital signs and the nursing notes.  Pertinent labs & imaging results that were available during my care of the patient were reviewed by me and considered in my medical decision making (see chart for details).    MDM Rules/Calculators/A&P                         Patient with history of chronic abdominal pain and GERD, presenting with similar symptoms that began last night after eating shrimp scampi.  She states her symptoms of pain are somewhat worse than before therefore she presented for evaluation.  She treats her symptoms intermittently with Mylanta, no regular PPIs or H2 blockers.  No urinary symptoms, vomiting, diarrhea, fevers or chills.  On exam she is very well-appearing and in no distress.  Abdomen is diffusely tender though worse in the epigastric region and left abdomen.  Blood work obtained in triage is unremarkable with white count of 9.2.  Normal renal and hepatic function, lipase within  normal limits, UA is negative.  Given patient's degree of tenderness, CT abdomen pelvis is ordered to evaluate for other causes of patient's symptoms aside from gastritis versus GERD.  Symptoms treated with diet cocktail as this is provided relief in the past, Protonix.  CT scan is negative.  Patient reevaluated with some improvement in symptoms, will give additional pain medication.  Presentation is likely secondary to recurrent gastritis.  Will prescribe PPI, recommend as needed antacids and follow-up with her GI specialist.  Return precautions discussed.  Discussed results, findings, treatment and follow up. Patient advised of return precautions. Patient verbalized understanding and agreed with plan.  Final Clinical Impression(s) / ED Diagnoses Final diagnoses:  Left upper quadrant abdominal pain    Rx / DC Orders ED Discharge Orders         Ordered    pantoprazole (PROTONIX) 40 MG  tablet  Daily PRN        03/31/20 1952    sucralfate (CARAFATE) 1 g tablet  3 times daily with meals & bedtime        03/31/20 1952           Tonni Mansour, [place] N, PA-C 03/31/20 1952    Melene Plan, DO 03/31/20 2029

## 2020-03-31 NOTE — Discharge Instructions (Addendum)
Please read instructions below. Drink clear liquids until your stomach feels better. Then, slowly introduce bland foods into your diet as tolerated.  Avoid spicy, greasy, acidic foods as this can worsen your symptoms.  Avoid NSAID medications such as Advil/ibuprofen/Motrin, Aleve, aspirin, Goody's powder, BC powder.  Remain is sitting upright for at least 45 minutes after meals. Take the carafate before meals and at bedtime to help with stomach upset. Take the omeprazole daily. You can get this over-the-counter after your prescription runs out. Follow up with your primary care provider/GI specialist. Return to the ER for severely worsening abdominal pain, fever, uncontrollable vomiting, or vomiting blood.

## 2020-04-22 ENCOUNTER — Ambulatory Visit: Payer: BC Managed Care – PPO | Admitting: Family Medicine

## 2020-10-28 NOTE — Progress Notes (Deleted)
NEUROLOGY FOLLOW UP OFFICE NOTE  [name] [id number]  Assessment/Plan:   Migraine with aura, without status migrainosus, not intractable  1.  Migraine rescue:  Tylenol (sumatriptan 2nd line) 2.  ***  Subjective:  Dana [name] a 60 year old left-handed woman who follows up for migraines.  UPDATE: Referred to Dr. Katrinka Blazing of Sports Medicine for neck pain.  ***   Rescue therapy:  Tylenol first line; sumatriptan 100mg  second line (hasn't needed).   HISTORY: In 2006, she had a viral illness, in which she had syncope and developed diffuse weakness and paresthesias. Neurologic workup and physical exam was normal.  I BPPV: She has had approximately 3 episodes of vertigo, described as spinning. In 2012, she woke up one morning with spinning sensation. It would last a few seconds with head movement. She also had nasal congestion. She was treated with antibiotics, which were ineffective. CT of her sinuses and MRI of brain with and without contrast from April 2012 were unremarkable. She had a positive Dix-Hallpike and was diagnosed with BPPV. She had another episode about a year later, which was severe and lasted a few seconds but afterwards she was nauseous and vomited. MRI and MRA of head from 04/28/12 was unremarkable. She had one other episode a few months ago, which quickly resolved. She has been evaluated by ENT and has deviated septum to the right.  II MIGRAINES:She started having migrainesin young adulthood. They usually occur at the back of her head. Typically they are mild butcanbe a severepoundingache. When severe,they are accompanied by nauseaand photophobia. They are preceded by visual aura of zigzag lines. She reports a prodrome of feeling strange. They occur for the day but she does not take any pain reliever for it. They occur once in a while, such as every 2 to several months.There are no triggers. Laying down to rest helps relieve  it.  III INTERMITTENT LEFT FACIAL TINGLING: On a couple of occasions, she reports brief episodes of left facial tingling. It occurred once in December 2016. It was thought to be due to her neck. It only lasts a day. She denied neck pain, facial droop, headache or focal numbness and weakness. They are not associated with her migraines.  PAST MEDICAL HISTORY: Past Medical History:  Diagnosis Date  . Abnormality of gait 07/18/2015  . Adhesive capsulitis of left shoulder 12/23/2017   Documented by her PCP and treated by 02/22/2018  . AR (allergic rhinitis) 11/08/2011  . BPPV (benign paroxysmal positional vertigo) 07/31/2015  . Chest pain, precordial 09/30/2017  . Disturbance in sleep behavior 12/30/2017  . DOE (dyspnea on exertion) 09/29/2017  . Family history of early CAD 09/29/2017  . Headache   . Insomnia 11/08/2011  . Leg length discrepancy 12/30/2017  . Menopause 11/08/2011   2009   . Migraine with aura and without status migrainosus, not intractable 07/31/2015  . Nasal septal deviation 11/08/2011  . Nonallopathic lesion of cervical region 11/03/2019  . Sternocleidomastoid muscle tenderness 12/30/2017  . Vertigo     MEDICATIONS: Current Outpatient Medications on File Prior to Visit  Medication Sig Dispense Refill  . cyclobenzaprine (FLEXERIL) 5 MG tablet Take 5 mg by mouth 3 (three) times daily as needed. (Patient not taking: Reported on 03/14/2020)    . dicyclomine (BENTYL) 20 MG tablet Take 1 tablet (20 mg total) by mouth every 12 (twelve) hours as needed (for abdominal pain/cramping). 15 tablet 0  . esomeprazole (NEXIUM) 40 MG capsule Take by mouth.    . meloxicam (  MOBIC) 7.5 MG tablet Take 7.5 mg by mouth 2 (two) times daily. (Patient not taking: Reported on 03/14/2020)    . ondansetron (ZOFRAN ODT) 4 MG disintegrating tablet Take 1 tablet (4 mg total) by mouth every 6 (six) hours as needed. 20 tablet 0  . pantoprazole (PROTONIX) 40 MG tablet Take 1 tablet (40 mg total) by mouth daily as  needed (abdominal pain, bloating, reflux). 30 tablet 0  . sucralfate (CARAFATE) 1 g tablet Take 1 tablet (1 g total) by mouth 4 (four) times daily -  with meals and at bedtime. 30 tablet 0  . SUMAtriptan (IMITREX) 100 MG tablet Take 1 tablet earliest onset of migraine.  May repeat once in 2 hours if headache persists or recurs.  Do not exceed 2 tablets in 24 hours 10 tablet 0  . Vitamin D, Ergocalciferol, (DRISDOL) 1.25 MG (50000 UNIT) CAPS capsule Take 1 capsule (50,000 Units total) by mouth every 7 (seven) days. 12 capsule 0   No current facility-administered medications on file prior to visit.    ALLERGIES: Allergies  Allergen Reactions  . Prednisone     Feels like skin crawls    FAMILY HISTORY: Family History  Problem Relation Age of Onset  . Hypertension Mother   . Hypertension Father   . Cancer Father   . Diabetes Father   . Coronary artery disease Father        History of CABG  . Parkinsonism Paternal Grandfather   . High Cholesterol Brother [phone]      Objective:  *** General: No acute distress.  Patient appears ***-groomed.   Head:  Normocephalic/atraumatic Eyes:  Fundi examined but not visualized Neck: supple, no paraspinal tenderness, full range of motion Heart:  Regular rate and rhythm Lungs:  Clear to auscultation bilaterally Back: No paraspinal tenderness Neurological Exam: alert and oriented to person, place, and time.  Speech fluent and not dysarthric, language intact.  CN II-XII intact. Bulk and tone normal, muscle strength 5/5 throughout.  Sensation to light touch intact.  Deep tendon reflexes 2+ throughout.  Finger to nose testing intact.  Gait normal, Romberg negative.   Shon Millet, DO  CC: Tracey Harries, MD

## 2020-10-30 ENCOUNTER — Ambulatory Visit: Payer: BC Managed Care – PPO | Admitting: Neurology

## 2020-11-11 NOTE — Progress Notes (Signed)
NEUROLOGY FOLLOW UP OFFICE NOTE  WESTYN KEATLEY [id number]  Assessment/Plan:   Migraine with aura, without status migrainosus, not intractable  1.  Migraine rescue:  Tylenol (sumatriptan 2nd line) 2.  Limit use of pain relievers to no more than 2 days out of week to prevent risk of rebound or medication-overuse headache. 3.  Keep headache diary 4.  Follow up one year or as needed.  Subjective:  Kyarah Enamorado is a 60 year old left-handed woman who follows up for migraines.   UPDATE: Referred to Dr. Katrinka Blazing of Sports Medicine for neck pain.  Which helped.  No migraines since last year.  Still has occasional tension headache but infrequent.  Slightly increased over the past month.  However, she suspects related to dry eye causing eye strain looking at the computer, seasonal allergies and just recently having Covid.  It has since improved but not completely resolved.    Rescue therapy:  Tylenol first line; sumatriptan 100mg  second line (hasn't needed).     HISTORY: In 2006, she had a viral illness, in which she had syncope and developed diffuse weakness and paresthesias.  Neurologic workup and physical exam was normal.   I  BPPV:  She has had approximately 3 episodes of vertigo, described as spinning.  In 2012, she woke up one morning with spinning sensation.  It would last a few seconds with head movement.  She also had nasal congestion.  She was treated with antibiotics, which were ineffective.  CT of her sinuses and MRI of brain with and without contrast from April 2012 were unremarkable.  She had a positive Dix-Hallpike and was diagnosed with BPPV.  She had another episode about a year later, which was severe and lasted a few seconds but afterwards she was nauseous and vomited.  MRI and MRA of head from 04/28/12 was unremarkable.  She had one other episode a few months ago, which quickly resolved.  She has been evaluated by ENT and has deviated septum to the right.   II  MIGRAINES:  She  started having migraines in young adulthood.  They usually occur at the back of her head.  Typically they are mild but can be a severe pounding ache.  When severe, they are accompanied by nausea and photophobia.  They are preceded by visual aura of zigzag lines.  She reports a prodrome of feeling strange.  They occur for the day but she does not take any pain reliever for it.  They occur once in a while, such as every 2 to several months.    There are no triggers.  Laying down to rest helps relieve it.   III  INTERMITTENT LEFT FACIAL TINGLING:  On a couple of occasions, she reports brief episodes of left facial tingling.  It occurred once in December 2016.  It was thought to be due to her neck.  It only lasts a day.  She denied neck pain, facial droop, headache or focal numbness and weakness.  They are not associated with her migraines.  PAST MEDICAL HISTORY: Past Medical History:  Diagnosis Date   Abnormality of gait 07/18/2015   Adhesive capsulitis of left shoulder 12/23/2017   Documented by her PCP and treated by 02/22/2018   AR (allergic rhinitis) 11/08/2011   BPPV (benign paroxysmal positional vertigo) 07/31/2015   Chest pain, precordial 09/30/2017   Disturbance in sleep behavior 12/30/2017   DOE (dyspnea on exertion) 09/29/2017   Family history of early CAD 09/29/2017   Headache  Insomnia 11/08/2011   Leg length discrepancy 12/30/2017   Menopause 11/08/2011   2009    Migraine with aura and without status migrainosus, not intractable 07/31/2015   Nasal septal deviation 11/08/2011   Nonallopathic lesion of cervical region 11/03/2019   Sternocleidomastoid muscle tenderness 12/30/2017   Vertigo     MEDICATIONS: Current Outpatient Medications on File Prior to Visit  Medication Sig Dispense Refill   cyclobenzaprine (FLEXERIL) 5 MG tablet Take 5 mg by mouth 3 (three) times daily as needed. (Patient not taking: Reported on 03/14/2020)     dicyclomine (BENTYL) 20 MG tablet Take 1 tablet (20 mg total) by  mouth every 12 (twelve) hours as needed (for abdominal pain/cramping). 15 tablet 0   esomeprazole (NEXIUM) 40 MG capsule Take by mouth.     meloxicam (MOBIC) 7.5 MG tablet Take 7.5 mg by mouth 2 (two) times daily. (Patient not taking: Reported on 03/14/2020)     ondansetron (ZOFRAN ODT) 4 MG disintegrating tablet Take 1 tablet (4 mg total) by mouth every 6 (six) hours as needed. 20 tablet 0   pantoprazole (PROTONIX) 40 MG tablet Take 1 tablet (40 mg total) by mouth daily as needed (abdominal pain, bloating, reflux). 30 tablet 0   sucralfate (CARAFATE) 1 g tablet Take 1 tablet (1 g total) by mouth 4 (four) times daily -  with meals and at bedtime. 30 tablet 0   SUMAtriptan (IMITREX) 100 MG tablet Take 1 tablet earliest onset of migraine.  May repeat once in 2 hours if headache persists or recurs.  Do not exceed 2 tablets in 24 hours 10 tablet 0   Vitamin D, Ergocalciferol, (DRISDOL) 1.25 MG (50000 UNIT) CAPS capsule Take 1 capsule (50,000 Units total) by mouth every 7 (seven) days. 12 capsule 0   No current facility-administered medications on file prior to visit.    ALLERGIES: Allergies  Allergen Reactions   Prednisone     Feels like skin crawls    FAMILY HISTORY: Family History  Problem Relation Age of Onset   Hypertension Mother    Hypertension Father    Cancer Father    Diabetes Father    Coronary artery disease Father        History of CABG   Parkinsonism Paternal Grandfather    High Cholesterol Brother 42      Objective:  Blood pressure 115/76, pulse 90, height 5\' 3"  (1.6 m), weight 131 lb 6.4 oz (59.6 kg), SpO2 99 %. General: No acute distress.  Patient appears well-groomed.   Head:  Normocephalic/atraumatic Eyes:  Fundi examined but not visualized Neck: supple, no paraspinal tenderness, full range of motion Heart:  Regular rate and rhythm Lungs:  Clear to auscultation bilaterally Back: No paraspinal tenderness Neurological Exam: alert and oriented to person, place,  and time.  Speech fluent and not dysarthric, language intact.  CN II-XII intact. Bulk and tone normal, muscle strength 5/5 throughout.  Sensation to light touch intact.  Deep tendon reflexes 2+ throughout, toes downgoing.  Finger to nose testing intact.  Gait normal, Romberg negative.   , DO  CC: Shon Millet, MD

## 2020-11-12 ENCOUNTER — Encounter: Payer: Self-pay | Admitting: Neurology

## 2020-11-12 ENCOUNTER — Ambulatory Visit: Payer: BC Managed Care – PPO | Admitting: Neurology

## 2020-11-12 ENCOUNTER — Other Ambulatory Visit: Payer: Self-pay

## 2020-11-12 VITALS — BP 115/76 | HR 90 | Ht 63.0 in | Wt 131.4 lb

## 2020-11-12 DIAGNOSIS — G43109 Migraine with aura, not intractable, without status migrainosus: Secondary | ICD-10-CM

## 2020-11-12 DIAGNOSIS — G44219 Episodic tension-type headache, not intractable: Secondary | ICD-10-CM

## 2020-11-12 MED ORDER — SUMATRIPTAN SUCCINATE 100 MG PO TABS
ORAL_TABLET | ORAL | 5 refills | Status: DC
Start: 2020-11-12 — End: 2021-11-12

## 2020-11-12 NOTE — Patient Instructions (Signed)
Refilled sumatriptan Limit use of pain relievers to no more than 2 days out of week to prevent risk of rebound or medication-overuse headache. Keep headache diary Follow up one year or as needed.

## 2020-11-22 ENCOUNTER — Other Ambulatory Visit: Payer: Self-pay

## 2020-11-22 ENCOUNTER — Encounter: Payer: Self-pay | Admitting: Cardiology

## 2020-11-22 ENCOUNTER — Ambulatory Visit: Payer: BC Managed Care – PPO | Admitting: Cardiology

## 2020-11-22 VITALS — BP 126/88 | HR 79 | Resp 18 | Ht 63.0 in | Wt 132.0 lb

## 2020-11-22 DIAGNOSIS — R072 Precordial pain: Secondary | ICD-10-CM

## 2020-11-22 DIAGNOSIS — Z8249 Family history of ischemic heart disease and other diseases of the circulatory system: Secondary | ICD-10-CM | POA: Diagnosis not present

## 2020-11-22 HISTORY — PX: OTHER SURGICAL HISTORY: SHX169

## 2020-11-22 NOTE — Patient Instructions (Signed)

## 2020-11-22 NOTE — Progress Notes (Signed)
Primary Care Provider: Bernerd Limbo, MD Cardiologist: None Electrophysiologist: None  Clinic Note: Chief Complaint  Patient presents with   Follow-up    Multiple complaints discussed     ===================================  ASSESSMENT/PLAN   Problem List Items Addressed This Visit     Chest pain, precordial    She has had intermittent episodes of chest discomfort here and there that truthfully sound musculoskeletal or even GI in nature.  With all of her ongoing GI symptoms is not unlikely that her symptoms are GI in nature.   Most time I spent with her was reassuring her about her symptoms.  Hopefully she can be evaluated by GI medicine that can come up and answer.  She is being evaluated for Lyme disease RMSF they have a sed rate ordered that was not back..  I explained to her that at this point any concerns of cardiac issues should be put on back burner until these issues are resolved.       Relevant Orders   EKG 12-Lead (Completed)   Family history of premature CAD - Primary (Chronic)    Very reassuring the results of the coronary calcium score of 0.  Low likelihood of significant CAD.  Certainly low likelihood of her atypical symptoms being caused by CAD. Her LDL most recent was 137, but would probably want to target LDL less than 100. She has been very reluctant to take any medications for cholesterol, and especially with her ongoing GI symptoms I would not want to risk potential misinterpretation of side effects.  As such, we will hold off on treating, we discussed lifestyle modifications and adjusting her diet.  If she is not able to achieve this on her own, then we may want to start treating.  Her blood pressure is well controlled.  And she is quit smoking.       Relevant Orders   EKG 12-Lead (Completed)   ===================================  HPI:    Dana [name] is a 60 y.o. female with a known family history of CAD and former long-term moking who had been  evaluated for chest pain and exertional dyspnea.  She presents today for annual follow-up.  Dana [name] was last seen on November 20, 2019 to follow-up coronary calcium score coronary CTA that was not completed due to difficulty with rate control would not take beta-blocker.  He was quite healthy from a cardiac Standpoint -> was playing 1 to 1-1/2 hours of pickleball every day.  Sometimes noted fast heart rates.  Also noted intermittent episodes of chest squeezing and tightness -> Coronary calcium score ordered.  Result was calcium score of 0.  Recent Hospitalizations: N/A  Reviewed  CV studies:    The following studies were reviewed today: (if available, images/films reviewed: From Epic Chart or Care Everywhere) Coronary calcium score 11/21/2019: Score is 0.  Normal coronary origins.  Minimal calcification in the ascending aorta.   Interval History:   Dana [name] returns for annual follow-up today with multiple different questions.  Back in October she started having intermittent episodes of dizziness but mostly GI type symptoms with poor p.o. intake and nausea etc.  She was otherwise doing pretty well over the course of the wintertime that she was definitely feeling better by May, able to exercise least 45 minutes.  However on May 30 she began feeling somewhat jittery and fatigued and tired.  Over the course the next 2 days she was having some anxiety feelings of fluttering and generally not feeling  well.  She was only able to walk about a mile without feeling poorly.  She then became concerned that she had been exposed to COVID-19 and on June 6 was diagnosed with COVID positive.  She really has not fully recovered since then and she is only about able to walk but I wonder core miles since recovery but has had multiple different issues with intermittent chest tightness and shortness of breath here and there.  But nothing persistent.   On June 24 she had an episode of tachycardia felt little  bit lightheaded but certainly not near syncopal.  She really has not fully been able to eat right since the episode back in October and the COVID seems to have exacerbated this issue.  She does not feel like she is able to maintain adequate nutrition, having intermittent loose stools and diarrhea and abdominal pains.  CV Review of Symptoms (Summary): positive for - irregular heartbeat, palpitations, and occasional sharp chest pains in different locations.  Fatigue lightheadedness with occasional rapid heart rate spells.  Lightheaded and dizzy but no true syncope or near syncope negative for - dyspnea on exertion, edema, orthopnea, paroxysmal nocturnal dyspnea, shortness of breath, or TIA or amaurosis fugax  REVIEWED OF SYSTEMS   Review of Systems  Constitutional:  Positive for malaise/fatigue and weight loss (Not eating as well).  HENT:  Negative for congestion and nosebleeds.   Respiratory:  Positive for shortness of breath (When she overexerts). Negative for cough.   Gastrointestinal:  Positive for diarrhea (More so just loose stools) and nausea. Negative for blood in stool and melena.       Conglomeration of GI symptoms.  Seems to be having intermittent nausea loose stools and not necessarily emesis.  Having a hard time tolerating foods.  Certain things upset her stomach has been not able to get in what she considers adequate nutrition  Genitourinary:  Negative for frequency, hematuria and urgency.  Musculoskeletal:  Positive for joint pain. Negative for myalgias.  Neurological:  Positive for dizziness and weakness (Global).  Psychiatric/Behavioral:  Positive for depression. Negative for memory loss. The patient is nervous/anxious and has insomnia.     PAST MEDICAL HISTORY   Past Medical History:  Diagnosis Date   Abnormality of gait 07/18/2015   Adhesive capsulitis of left shoulder 12/23/2017   Documented by her PCP and treated by Katrine Coho   AR (allergic rhinitis) 11/08/2011   BPPV  (benign paroxysmal positional vertigo) 07/31/2015   Chest pain, precordial 09/30/2017   Disturbance in sleep behavior 12/30/2017   DOE (dyspnea on exertion) 09/29/2017   Family history of early CAD 09/29/2017   Headache    Insomnia 11/08/2011   Leg length discrepancy 12/30/2017   Menopause 11/08/2011   2009    Migraine with aura and without status migrainosus, not intractable 07/31/2015   Nasal septal deviation 11/08/2011   Nonallopathic lesion of cervical region 11/03/2019   Sternocleidomastoid muscle tenderness 12/30/2017   Vertigo     PAST SURGICAL HISTORY   No past surgical history on file.  Immunization History  Administered Date(s) Administered   Influenza Split 04/04/2012   Tdap 08/28/2015    MEDICATIONS/ALLERGIES   Current Meds  Medication Sig   dicyclomine (BENTYL) 20 MG tablet Take 1 tablet (20 mg total) by mouth every 12 (twelve) hours as needed (for abdominal pain/cramping).   famotidine (PEPCID) 20 MG tablet Take 20 mg by mouth 2 (two) times daily. Pt takes it qd-bid   meloxicam (MOBIC) 7.5 MG tablet  Take 7.5 mg by mouth 2 (two) times daily as needed.   ondansetron (ZOFRAN ODT) 4 MG disintegrating tablet Take 1 tablet (4 mg total) by mouth every 6 (six) hours as needed.   SUMAtriptan (IMITREX) 100 MG tablet Take 1 tablet earliest onset of migraine.  May repeat once in 2 hours if headache persists or recurs.  Do not exceed 2 tablets in 24 hours    Allergies  Allergen Reactions   Prednisone     Feels like skin crawls    SOCIAL HISTORY/FAMILY HISTORY   Reviewed in Epic:  Pertinent findings:  Social History   Tobacco Use   Smoking status: Former   Smokeless tobacco: Never   Tobacco comments:    in [employer] Use   Vaping Use: Never used  Substance Use Topics   Alcohol use: Yes    Comment: occasionally   Drug use: Never   Social History   Social History Narrative   Left handed   Lives with husband two story home    OBJCTIVE -PE, EKG, labs   Wt  Readings from Last 3 Encounters:  11/22/20 132 lb (59.9 kg)  11/12/20 131 lb 6.4 oz (59.6 kg)  03/14/20 137 lb (62.1 kg)    Physical Exam: BP 126/88 (BP Location: Left Arm, Patient Position: Sitting, Cuff Size: Normal)   Pulse 79   Resp 18   Ht _0  (1.6 m)   Wt 132 lb (59.9 kg)   SpO2 99%   BMI 23.38 kg/m  Physical Exam Constitutional:      General: She is not in acute distress.    Appearance: Normal appearance. She is normal weight. She is not ill-appearing or toxic-appearing.  HENT:     Head: Normocephalic and atraumatic.  Neck:     Vascular: No carotid bruit.  Cardiovascular:     Rate and Rhythm: Normal rate and regular rhythm.     Pulses: Normal pulses.     Heart sounds: Normal heart sounds. No murmur heard.   No friction rub. No gallop.  Pulmonary:     Effort: Pulmonary effort is normal. No respiratory distress.     Breath sounds: Normal breath sounds.  Chest:     Chest wall: No tenderness.  Musculoskeletal:        General: No swelling. Normal range of motion.     Cervical back: Normal range of motion and neck supple.  Skin:    General: Skin is warm and dry.  Neurological:     General: No focal deficit present.     Mental Status: She is alert and oriented to person, place, and time. Mental status is at baseline.     Motor: No weakness.  Psychiatric:        Mood and Affect: Mood normal.        Behavior: Behavior normal.        Thought Content: Thought content normal.        Judgment: Judgment normal.     Comments: Very anxious, somewhat pressured.  Has a long list of questions and concerns.    Adult ECG Report  Rate: 79 ;  Rhythm: normal sinus rhythm and normal axis, intervals and durations. ;   Narrative Interpretation: Normal EKG  Recent Labs: ESR pending [place] to Lipid Panel Component 09/04/20 08/31/19  Cholesterol, Total 225 High  231 High   Triglycerides 86 69  HDL 73 81  VLDL Cholesterol Cal 15 12  LDL 137 High  138 High  [facility] Related to POCT CoVid-19 nucleic acid Component 10/28/20  05/02/20   SARS-COV-2, NAA Positive Abnormal     Comprehensive Metabolic Panel Component 29/84/73   Glucose 99  BUN 12  Creatinine 0.77  eGFR 88  BUN/Creatinine Ratio 16  Sodium 139  Potassium 4.0  Chloride 99  CO2 23  CALCIUM 9.8  Total Protein 7.2  Albumin, Serum 4.2  Globulin, Total 3.0  Albumin/Globulin Ratio 1.4  Total Bilirubin 0.3  Alkaline Phosphatase 67  AST 22  ALT (SGPT) 21   Component 11/11/20   TSH 1.320  WBC 8.0 (24% lymph)  Hgb 12.9  HCT 36.8  Plt 296   ==================================================  COVID-19 Education: The signs and symptoms of COVID-19 were discussed with the patient and how to seek care for testing (follow up with PCP or arrange E-visit).    I spent a total of 23 minutes with the patient spent in direct patient consultation.  Additional time spent with chart review  / charting (studies, outside notes, etc): 15 min Total Time: 38 min  Current medicines are reviewed at length with the patient today.  (+/- concerns) N/A  This visit occurred during the SARS-CoV-2 public health emergency.  Safety protocols were in place, including screening questions prior to the visit, additional usage of staff PPE, and extensive cleaning of exam room while observing appropriate contact time as indicated for disinfecting solutions.  Notice: This dictation was prepared with Dragon dictation along with smaller phrase technology. Any transcriptional errors that result from this process are unintentional and may not be corrected upon review.  Patient Instructions / Medication Changes & Studies & Tests Ordered   Patient Instructions  Medication Instructions:   No changes  *If you need a refill on your cardiac medications before your next appointment, please call your pharmacy*   Lab Work:  Not needed   Testing/Procedures:  Not needed  Follow-Up: At [facility], you and your health needs are our priority.  As part of our continuing mission to provide you with exceptional heart care, we have created designated Provider Care Teams.  These Care Teams include your primary Cardiologist (physician) and Advanced Practice Providers (APPs -  Physician Assistants and Nurse Practitioners) who all work together to provide you with the care you need, when you need it.     Your next appointment:   12 month(s)  The format for your next appointment:   In Person  Provider:   Glenetta Hew, MD     Studies Ordered:   Orders Placed This Encounter  Procedures   EKG 12-Lead     Glenetta Hew, M.D., M.S. Interventional Cardiologist   Pager # [phone] Phone # [phone] [address] [place], [place] [address]   Thank you for choosing Heartcare at [facility]!!

## 2020-12-06 ENCOUNTER — Telehealth: Payer: Self-pay | Admitting: Cardiology

## 2020-12-06 NOTE — Telephone Encounter (Signed)
Patient called in stating that she had chest pain yesterday. She states that she just seen Dr.Harding on 07/01. And he mentioned to her to call back if she continues to have issues. She states during these episodes her arm feels funny, and she just was not feeling well. She states that ever since COVID she still has not felt well,  she does not have BP numbers during this episode but states her pulse is okay (did not provide number) she is not currently having chest pains, but just states she is sluggish today, and feels shaky, but nothing at all like yesterday. She denied SOB, or swelling with the episode yesterday, but states she felt nauseated but she does have GI issues so not sure if it is related or not. She was advised her most recent cardiac score was 0- which we discussed was a good thing, but patient does mention when doing any activities she notices this pain in her chest. I advised I would route to MD to see if he suggested to get any further testing or what he recommended to do and we would let her know.

## 2020-12-06 NOTE — Telephone Encounter (Signed)
Pt c/o of Chest Pain: STAT if CP now or developed within 24 hours  1. Are you having CP right now? Yes  2. Are you experiencing any other symptoms (ex. SOB, nausea, vomiting, sweating)? No  3. How long have you been experiencing CP? Since yesterday  4. Is your CP continuous or coming and going? Continuous  5. Have you taken Nitroglycerin? No ?

## 2020-12-07 ENCOUNTER — Encounter: Payer: Self-pay | Admitting: Cardiology

## 2020-12-07 NOTE — Assessment & Plan Note (Signed)
Very reassuring the results of the coronary calcium score of 0.  Low likelihood of significant CAD.  Certainly low likelihood of her atypical symptoms being caused by CAD. Her LDL most recent was 137, but would probably want to target LDL less than 100. She has been very reluctant to take any medications for cholesterol, and especially with her ongoing GI symptoms I would not want to risk potential misinterpretation of side effects.  As such, we will hold off on treating, we discussed lifestyle modifications and adjusting her diet.  If she is not able to achieve this on her own, then we may want to start treating.  Her blood pressure is well controlled.  And she is quit smoking.

## 2020-12-07 NOTE — Assessment & Plan Note (Signed)
She has had intermittent episodes of chest discomfort here and there that truthfully sound musculoskeletal or even GI in nature.  With all of her ongoing GI symptoms is not unlikely that her symptoms are GI in nature.   Most time I spent with her was reassuring her about her symptoms.  Hopefully she can be evaluated by GI medicine that can come up and answer.  She is being evaluated for Lyme disease RMSF they have a sed rate ordered that was not back..  I explained to her that at this point any concerns of cardiac issues should be put on back burner until these issues are resolved.

## 2020-12-09 NOTE — Telephone Encounter (Signed)
Called patient, advised that she does mention having shortness of breath, and chest discomfort- she would like to try the monitor to make sure it does not show anything.  I advised I would route to message to MD to advise and give the okay to order and I would let her know.  Patient verbalized understanding.

## 2020-12-10 ENCOUNTER — Other Ambulatory Visit: Payer: Self-pay

## 2020-12-10 ENCOUNTER — Ambulatory Visit (INDEPENDENT_AMBULATORY_CARE_PROVIDER_SITE_OTHER): Payer: BC Managed Care – PPO

## 2020-12-10 ENCOUNTER — Other Ambulatory Visit: Payer: Self-pay | Admitting: Cardiology

## 2020-12-10 DIAGNOSIS — R42 Dizziness and giddiness: Secondary | ICD-10-CM

## 2020-12-10 DIAGNOSIS — R072 Precordial pain: Secondary | ICD-10-CM

## 2020-12-10 DIAGNOSIS — R000 Tachycardia, unspecified: Secondary | ICD-10-CM

## 2020-12-10 DIAGNOSIS — R002 Palpitations: Secondary | ICD-10-CM

## 2020-12-10 NOTE — Progress Notes (Unsigned)
Patient enrolled for [employer] to mail a 14 day ZIO XT monitor to address on file. 

## 2020-12-10 NOTE — Telephone Encounter (Signed)
Called patient, advised that monitor was ordered.  Patient aware it will be mailed.  No further questions.  Patient verbalized understanding.

## 2020-12-12 ENCOUNTER — Telehealth: Payer: Self-pay | Admitting: Cardiology

## 2020-12-12 NOTE — Telephone Encounter (Signed)
STAT if HR is under 50 or over 120 (normal HR is 60-100 beats per minute)  What is your heart rate? 103, been higher- mostly ranging in the 90's  Do you have a log of your heart rate readings (document readings)? no  Do you have any other symptoms? Chest discomfort -Patient wants to see Dr Herbie Baltimore or  his APP

## 2020-12-12 NOTE — Telephone Encounter (Signed)
Returned call to pt she states that yesterday her HR "felt high" her HR is "high for her" today HR is 103. Her HR is "not over 110 " BP today 113/76 95, usually ranges 110/70 - 120's/80's. She does have h/o COVID. She does not take any cardiac meds. She also has been having GI issues for awhile. But this "got worse" Thursday is also when she had increased chest discomfort. She had colonoscopy & EGD recently. So it may be related to that. Monitor was ordered and was ordered and mailed 12-10-20 so she should receive it any day.She will log BP/HR with comments. She will go to the ER if CP increases. Verbalized understanding. Will forward to [facility] nurse.

## 2020-12-13 DIAGNOSIS — R000 Tachycardia, unspecified: Secondary | ICD-10-CM

## 2020-12-13 DIAGNOSIS — R072 Precordial pain: Secondary | ICD-10-CM | POA: Diagnosis not present

## 2020-12-13 DIAGNOSIS — R002 Palpitations: Secondary | ICD-10-CM | POA: Diagnosis not present

## 2020-12-13 DIAGNOSIS — R42 Dizziness and giddiness: Secondary | ICD-10-CM

## 2020-12-23 ENCOUNTER — Telehealth: Payer: Self-pay | Admitting: Cardiology

## 2020-12-23 NOTE — Telephone Encounter (Signed)
Agree with recommendations.  Aidynn Polendo, MD  

## 2020-12-23 NOTE — Telephone Encounter (Signed)
Was talking to the patient and the phone went dead. Tried to call her back and was unable to get through.

## 2020-12-23 NOTE — Telephone Encounter (Signed)
Pt c/o of Chest Pain: STAT if CP now or developed within 24 hours  1. Are you having CP right now? Chest discomfort now  2. Are you experiencing any other symptoms (ex. SOB, nausea, vomiting, sweating)? Nausea and funny taste in her mouth. She gets this with her acid reflux and feels a little flushed. BP 145/87 HR 87. She is wearing a heart monitor and keeps getting an alert for an irregular HR.  3. How long have you been experiencing CP? Chest discomfort comes and goes some days. Did not have it yesterday but it started back today and has been continuous all day.   4. Is your CP continuous or coming and going? Continuous all day  5. Have you taken Nitroglycerin? No     ?

## 2020-12-23 NOTE — Telephone Encounter (Signed)
Patient called back, states the call was disconnected. Please return call to discuss further when able.

## 2020-12-23 NOTE — Telephone Encounter (Signed)
Spoke with pt, she has been having issues with chest pain that she feels is related to reflux and GI issues that she is currently having as well. Aware calcium score is 0 and the pain she is having does not sound cardiac. She is more concerned because when she checks her bp the monitor warns of irregular heart rhythm and to contact her doctor. She is currently wearing a zio but it is not live. She denies any palpitations but will sometimes feels like her rate is fast but then it will be normal when checked. She is concerned because they believe she has long term covid and she is anxious about the effects it may have on her heart. ER precautions discussed with the patient but aware getting the results of the monitor will help determine if any treatment is needed or not. She would like to make dr harding aware of the abnormal pulse reported by her bp machine. Will forward for his review.

## 2020-12-24 NOTE — Telephone Encounter (Signed)
Spoke with pt, aware of dr harding's recommendations. 

## 2021-01-07 ENCOUNTER — Encounter (HOSPITAL_BASED_OUTPATIENT_CLINIC_OR_DEPARTMENT_OTHER): Payer: Self-pay

## 2021-01-07 ENCOUNTER — Emergency Department (HOSPITAL_BASED_OUTPATIENT_CLINIC_OR_DEPARTMENT_OTHER): Payer: BC Managed Care – PPO | Admitting: Radiology

## 2021-01-07 ENCOUNTER — Other Ambulatory Visit: Payer: Self-pay

## 2021-01-07 ENCOUNTER — Emergency Department (HOSPITAL_BASED_OUTPATIENT_CLINIC_OR_DEPARTMENT_OTHER)
Admission: EM | Admit: 2021-01-07 | Discharge: 2021-01-07 | Disposition: A | Discharge status: Home / Self Care | Payer: BC Managed Care – PPO | Attending: Emergency Medicine | Admitting: Emergency Medicine

## 2021-01-07 ENCOUNTER — Telehealth: Payer: Self-pay | Admitting: Cardiology

## 2021-01-07 DIAGNOSIS — Z87891 Personal history of nicotine dependence: Secondary | ICD-10-CM | POA: Insufficient documentation

## 2021-01-07 DIAGNOSIS — R002 Palpitations: Secondary | ICD-10-CM | POA: Diagnosis not present

## 2021-01-07 DIAGNOSIS — R0789 Other chest pain: Secondary | ICD-10-CM | POA: Diagnosis present

## 2021-01-07 LAB — CBC WITH DIFFERENTIAL/PLATELET
Abs Immature Granulocytes: 0.02 10*3/uL (ref 0.00–0.07)
Basophils Absolute: 0.1 10*3/uL (ref 0.0–0.1)
Basophils Relative: 1 %
Eosinophils Absolute: 0.1 10*3/uL (ref 0.0–0.5)
Eosinophils Relative: 1 %
HCT: 39 % (ref 36.0–46.0)
Hemoglobin: 13.7 g/dL (ref 12.0–15.0)
Immature Granulocytes: 0 %
Lymphocytes Relative: 18 %
Lymphs Abs: 1.4 10*3/uL (ref 0.7–4.0)
MCH: 30.6 pg (ref 26.0–34.0)
MCHC: 35.1 g/dL (ref 30.0–36.0)
MCV: 87.1 fL (ref 80.0–100.0)
Monocytes Absolute: 0.5 10*3/uL (ref 0.1–1.0)
Monocytes Relative: 6 %
Neutro Abs: 5.4 10*3/uL (ref 1.7–7.7)
Neutrophils Relative %: 74 %
Platelets: 238 10*3/uL (ref 150–400)
RBC: 4.48 MIL/uL (ref 3.87–5.11)
RDW: 12.7 % (ref 11.5–15.5)
WBC: 7.3 10*3/uL (ref 4.0–10.5)
nRBC: 0 % (ref 0.0–0.2)

## 2021-01-07 LAB — TROPONIN I (HIGH SENSITIVITY): Troponin I (High Sensitivity): <2 ng/L (ref <18)

## 2021-01-07 LAB — BASIC METABOLIC PANEL
Anion gap: 8 (ref 5–15)
BUN: 12 mg/dL (ref 6–20)
CO2: 29 mmol/L (ref 22–32)
Calcium: 10 mg/dL (ref 8.9–10.3)
Chloride: 100 mmol/L (ref 98–111)
Creatinine, Ser: 0.58 mg/dL (ref 0.44–1.00)
GFR, Estimated: >60 mL/min (ref >60)
Glucose, Bld: 108 mg/dL — ABNORMAL HIGH (ref 70–99)
Potassium: 3.7 mmol/L (ref 3.5–5.1)
Sodium: 137 mmol/L (ref 135–145)

## 2021-01-07 NOTE — Telephone Encounter (Signed)
Returned pt call. Pt reports she has had episodes of increased heart rate and chest pain since having covid in June. Pt reports her heart rate is currently 84 with a blood pressure of 129/86, but she reports she has had episodes recently where her heart rate is in the 100s-120s. She denies shortness of breath and dizziness. Pt does report she has chest pain "on and off" and is currently having chest pain. She reports the pain is in the center of her chest. Due to patient having active chest pain, nurse advised pt she should be taken to the emergency room or call 911. Pt verbalized understanding of instructions.

## 2021-01-07 NOTE — ED Notes (Signed)
Patient transported to X-ray 

## 2021-01-07 NOTE — ED Provider Notes (Signed)
[facility] EMERGENCY DEPT Provider Note   CSN: [id number] Arrival date & time: 01/07/21  1222     History Chief Complaint  Patient presents with   Palpitations   Chest Pain         Dana [name] is a 60 y.o. female.  Presents to ER with concern for chest discomfort, palpitations.  Patient reports that for the last few months she has been concerned about palpitations and elevation in her heart rate.  Has been seen by her primary doctor and cardiology.  Has completed a cardiac monitor that showed patient's average heart rate was normal, did have a couple brief episodes of nonsustained V. tach per my review however patient was not symptomatic at that time.  Primary care doctor had ordered outpatient CT scan on Friday which was negative for pulmonary embolism.  Outpatient lab work has all been normal.  Patient reports today no significant change in symptoms, concerned about persistence.  Describes chest pain as discomfort, midsternal.  Nonradiating.  Constant.  No alleviating or aggravating factors.  Was able to exercise yesterday, no change in pain.  No difficulty in breathing.  Additional history obtained from chart review, care everywhere.  HPI     Past Medical History:  Diagnosis Date   Abnormality of gait 07/18/2015   Adhesive capsulitis of left shoulder 12/23/2017   Documented by her PCP and treated by Sharen Hones   AR (allergic rhinitis) 11/08/2011   BPPV (benign paroxysmal positional vertigo) 07/31/2015   Chest pain, precordial 09/30/2017   Disturbance in sleep behavior 12/30/2017   DOE (dyspnea on exertion) 09/29/2017   Family history of early CAD 09/29/2017   Headache    Insomnia 11/08/2011   Leg length discrepancy 12/30/2017   Menopause 11/08/2011   2009    Migraine with aura and without status migrainosus, not intractable 07/31/2015   Nasal septal deviation 11/08/2011   Nonallopathic lesion of cervical region 11/03/2019   Sternocleidomastoid muscle tenderness  12/30/2017   Vertigo     Patient Active Problem List   Diagnosis Date Noted   Low back pain 03/14/2020   Patellofemoral syndrome of right knee 01/23/2020   Nonallopathic lesion of cervical region 11/03/2019   Sternocleidomastoid muscle tenderness 12/30/2017   Leg length discrepancy 12/30/2017   Disturbance in sleep behavior 12/30/2017   Neck pain 12/23/2017   Adhesive capsulitis of left shoulder 12/23/2017   Chest pain, precordial 09/30/2017   Pre-op testing 09/30/2017   DOE (dyspnea on exertion) 09/29/2017   Family history of premature CAD 09/29/2017   Pain of left heel 06/02/2016   BPPV (benign paroxysmal positional vertigo) 07/31/2015   Migraine with aura and without status migrainosus, not intractable 07/31/2015   Abnormality of gait 07/18/2015   Pain in joint, ankle and foot 06/20/2013   Menopause 11/08/2011   Nasal septal deviation 11/08/2011   Insomnia 11/08/2011   AR (allergic rhinitis) 11/08/2011    Past Surgical History:  Procedure Laterality Date   DILATION AND CURETTAGE OF UTERUS       OB History   No obstetric history on file.     Family History  Problem Relation Age of Onset   Hypertension Mother    Hypertension Father    Cancer Father    Diabetes Father    Coronary artery disease Father        History of CABG   Parkinsonism Paternal Grandfather    High Cholesterol Brother 38    Social History   Tobacco Use  Smoking status: Former   Smokeless tobacco: Never   Tobacco comments:    in Chief of Staff Use   Vaping Use: Never used  Substance Use Topics   Alcohol use: Yes    Comment: occasionally   Drug use: Never    Home Medications Prior to Admission medications   Medication Sig Start Date End Date Taking? Authorizing Provider  famotidine (PEPCID) 20 MG tablet Take 20 mg by mouth 2 (two) times daily. Pt takes it qd-bid   Yes [provider]  dicyclomine (BENTYL) 20 MG tablet Take 1 tablet (20 mg total) by mouth every 12  (twelve) hours as needed (for abdominal pain/cramping). 09/19/18   Antony Madura, PA-C  meloxicam (MOBIC) 7.5 MG tablet Take 7.5 mg by mouth 2 (two) times daily as needed. 03/01/20   [provider]  ondansetron (ZOFRAN ODT) 4 MG disintegrating tablet Take 1 tablet (4 mg total) by mouth every 6 (six) hours as needed. 04/22/18   Ward, Layla Maw, DO  SUMAtriptan (IMITREX) 100 MG tablet Take 1 tablet earliest onset of migraine.  May repeat once in 2 hours if headache persists or recurs.  Do not exceed 2 tablets in 24 hours 11/12/20   Drema Dallas, DO    Allergies    Prednisone  Review of Systems   Review of Systems  Constitutional:  Negative for chills and fever.  HENT:  Negative for ear pain and sore throat.   Eyes:  Negative for pain and visual disturbance.  Respiratory:  Negative for cough and shortness of breath.   Cardiovascular:  Positive for chest pain and palpitations.  Gastrointestinal:  Negative for abdominal pain and vomiting.  Genitourinary:  Negative for dysuria and hematuria.  Musculoskeletal:  Negative for arthralgias and back pain.  Skin:  Negative for color change and rash.  Neurological:  Negative for seizures and syncope.  All other systems reviewed and are negative.  Physical Exam Updated Vital Signs BP (!) 150/95 (BP Location: Right Arm)   Pulse 90   Temp 98.1 F (36.7 C) (Oral)   Resp 16   Ht 5\' 3"  (1.6 m)   Wt 59 kg   SpO2 100%   BMI 23.03 kg/m   Physical Exam Vitals and nursing note reviewed.  Constitutional:      General: She is not in acute distress.    Appearance: She is well-developed.  HENT:     Head: Normocephalic and atraumatic.  Eyes:     Conjunctiva/sclera: Conjunctivae normal.  Cardiovascular:     Rate and Rhythm: Normal rate and regular rhythm.     Heart sounds: No murmur heard. Pulmonary:     Effort: Pulmonary effort is normal. No respiratory distress.     Breath sounds: Normal breath sounds.  Chest:     Chest wall: No  tenderness or crepitus.  Abdominal:     Palpations: Abdomen is soft.     Tenderness: There is no abdominal tenderness.  Musculoskeletal:     Cervical back: Neck supple.     Right lower leg: No edema.     Left lower leg: No edema.  Skin:    General: Skin is warm and dry.  Neurological:     General: No focal deficit present.     Mental Status: She is alert.  Psychiatric:        Mood and Affect: Mood normal.    ED Results / Procedures / Treatments   Labs (all labs ordered are listed, but only abnormal results  are displayed) Labs Reviewed  BASIC METABOLIC PANEL - Abnormal; Notable for the following components:      Result Value   Glucose, Bld 108 (*)    All other components within normal limits  CBC WITH DIFFERENTIAL/PLATELET  TROPONIN I (HIGH SENSITIVITY)    EKG EKG Interpretation  Date/Time:  Tuesday January 07 2021 12:46:12 EDT Ventricular Rate:  78 PR Interval:  119 QRS Duration: 91 QT Interval:  375 QTC Calculation: 428 R Axis:   -3 Text Interpretation: Sinus rhythm Borderline short PR interval Confirmed by Marianna Fuss ([id number]) on 01/07/2021 1:31:48 PM  Radiology DG Chest 2 View  Result Date: 01/07/2021 CLINICAL DATA:  Chest pain and tachycardia for 1 month, midsternal and epigastric chest discomfort, former smoker EXAM: CHEST - 2 VIEW COMPARISON:  None FINDINGS: Normal heart size, mediastinal contours, and pulmonary vascularity. Lungs minimally hyperinflated with central peribronchial thickening. Area of prominence at the LEFT lung base appears to represent summation artifact of pulmonary markings with the anterior LEFT sixth/posterior LEFT tenth ribs. No acute infiltrate, pleural effusion, or pneumothorax. Osseous structures unremarkable. IMPRESSION: Hyperinflation and peribronchial thickening which could reflect asthma or COPD. No acute infiltrate. Electronically Signed   By: Ulyses Southward M.D.   On: 01/07/2021 13:23    Procedures Procedures   Medications Ordered  in ED Medications - No data to display    Long term heart monitor Narrative & Impression    Predominant rhythm is sinus rhythm with a minimum heart rate 53 bpm, maximum 179 bpm with average heart rate 78.  6 short bursts of PAT/PSVT fastest was 7 beats with intermittent V. tach, longest was 14 beats at a rate of 113 bpm.-No sustained arrhythmia.  Rare isolated PACs and PVCs. Very PAC couplets and triplets. No bigeminy or trigeminy.  Diary episodes are related to sinus rhythm. None of the bursts of PAT/PSVT were documented as symptomatic.      ED Course  I have reviewed the triage vital signs and the nursing notes.  Pertinent labs & imaging results that were available during my care of the patient were reviewed by me and considered in my medical decision making (see chart for details).    MDM Rules/Calculators/A&P                           60 year old lady presenting to ER with concern for chest discomfort, palpitations.  On exam patient is remarkably well-appearing in no acute distress.  Per review of chart, symptom has been ongoing for quite some time and has been evaluated by cardiology and primary care doctor.  CT PE study performed a couple days ago was negative.  Her basic labs are stable.  EKG is grossly within normal limits today.  CXR negative.  Troponin undetectable.  Doubt ACS.  Given her current well appearance and reassuring work-up today, believe she can continue to be managed in the outpatient setting.  Recommend she follow-up both with her primary doctor and cardiology.  Discharged home.   After the discussed management above, the patient was determined to be safe for discharge.  The patient was in agreement with this plan and all questions regarding their care were answered.  ED return precautions were discussed and the patient will return to the ED with any significant worsening of condition.  Final Clinical Impression(s) / ED Diagnoses Final diagnoses:  Chest  discomfort  Palpitation    Rx / DC Orders ED Discharge Orders  None        Milagros Lollykstra, Devansh Riese S, MD 01/07/21 [id number]

## 2021-01-07 NOTE — Telephone Encounter (Signed)
Spoke with patient of Dr. Herbie Baltimore who reports episodes of faster heart rate although Apple Watch indicates HR is WNL. She did have some ectopy on monitor, which could be contributing to her current symptoms. Advised that since she has concurrent chest discomfort, she seek ED care. She was advised she can go to a main [facility] campus or [facility] which is close for her. Explained an EKG can be done, labs if needed, and if she has any cardiac concerns they can be addressed in the immediate

## 2021-01-07 NOTE — Discharge Instructions (Addendum)
Follow-up with your cardiologist and your primary care doctor.  Come back to ER if you develop worsening chest pain, difficulty breathing or other new concerning symptom.

## 2021-01-07 NOTE — Telephone Encounter (Signed)
Patient wanted further clarification as to when to be concerned if her HR is elevated. She wears an Apple watch and wanted to know what is considered high/ low.   STAT if HR is under 50 or over 120 (normal HR is 60-100 beats per minute)  What is your heart rate? 95 (per apple watch)  Do you have a log of your heart rate readings (document readings)? no  Do you have any other symptoms? Chest discomfort, but the patient has indigestion and acid reflux so it is not anything new

## 2021-01-07 NOTE — ED Triage Notes (Signed)
Pt reports fast heart rate for approximately a month.  Also reports mid sternal/epigastric chest discomfort.  States she had a chest CT on Friday, 8/12, which she states was negative for PE.

## 2021-01-08 NOTE — Telephone Encounter (Signed)
Patient receive results on 01/07/21 of monitor recently worn . Appointment was moved up to 02/06/21

## 2021-01-23 NOTE — Telephone Encounter (Signed)
I think the medicines we need to be sending her is a necessary reassurance.  She had a coronary calcium score of 0 last year.  She has relatively low risk of having actual cardiac symptoms.  Her monitor was relatively benign.  She may have short little bursts but I doubt that she is having significant arrhythmias.  We can discuss possibly setting up a stress test for coronary CTA @ follow-up visit.  Bryan Lemma, MD

## 2021-01-30 ENCOUNTER — Other Ambulatory Visit: Payer: Self-pay

## 2021-01-30 ENCOUNTER — Encounter: Payer: Self-pay | Admitting: Dietician

## 2021-01-30 ENCOUNTER — Encounter: Payer: BC Managed Care – PPO | Attending: Gastroenterology | Admitting: Dietician

## 2021-01-30 DIAGNOSIS — R1013 Epigastric pain: Secondary | ICD-10-CM | POA: Diagnosis present

## 2021-01-30 DIAGNOSIS — Z713 Dietary counseling and surveillance: Secondary | ICD-10-CM | POA: Insufficient documentation

## 2021-01-30 NOTE — Patient Instructions (Addendum)
Choose foods that are low in fat. Continue to avoid alcohol. Consider trial of a probiotic Avoid Coffee Avoid emulsifiers such as Carageenan Avoid Inulin Add a new food back into your diet every 2 days. Slowly increase your fiber (goal 25 grams)  Book:  Fiber Fueled by Lowanda Foster, MD or his cookbook
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
## 2021-02-06 ENCOUNTER — Encounter: Payer: Self-pay | Admitting: Cardiology

## 2021-02-06 ENCOUNTER — Other Ambulatory Visit: Payer: Self-pay

## 2021-02-06 ENCOUNTER — Ambulatory Visit: Payer: BC Managed Care – PPO | Admitting: Cardiology

## 2021-02-06 VITALS — BP 120/90 | HR 99 | Ht 63.0 in | Wt 131.8 lb

## 2021-02-06 DIAGNOSIS — R5382 Chronic fatigue, unspecified: Secondary | ICD-10-CM

## 2021-02-06 DIAGNOSIS — R0600 Dyspnea, unspecified: Secondary | ICD-10-CM

## 2021-02-06 DIAGNOSIS — G9331 Postviral fatigue syndrome: Secondary | ICD-10-CM

## 2021-02-06 DIAGNOSIS — R6889 Other general symptoms and signs: Secondary | ICD-10-CM | POA: Diagnosis not present

## 2021-02-06 DIAGNOSIS — R002 Palpitations: Secondary | ICD-10-CM

## 2021-02-06 DIAGNOSIS — R0609 Other forms of dyspnea: Secondary | ICD-10-CM

## 2021-02-06 DIAGNOSIS — G933 Postviral fatigue syndrome: Secondary | ICD-10-CM

## 2021-02-06 DIAGNOSIS — G9332 Myalgic encephalomyelitis/chronic fatigue syndrome: Secondary | ICD-10-CM | POA: Insufficient documentation

## 2021-02-06 DIAGNOSIS — R5383 Other fatigue: Secondary | ICD-10-CM | POA: Insufficient documentation

## 2021-02-06 DIAGNOSIS — U099 Post covid-19 condition, unspecified: Secondary | ICD-10-CM

## 2021-02-06 DIAGNOSIS — Z8249 Family history of ischemic heart disease and other diseases of the circulatory system: Secondary | ICD-10-CM | POA: Diagnosis not present

## 2021-02-06 NOTE — Progress Notes (Addendum)
Primary Care Provider: Bernerd Limbo, MD Cardiologist: None Electrophysiologist: None  Clinic Note: Chief Complaint  Patient presents with   Hospitalization Follow-up    ER follow-up; just not feeling well.  Shortness of breath on exertion, fatigue.  Palpitations are better.    ===================================  ASSESSMENT/PLAN   Problem List Items Addressed This Visit     Fatigue    Probably related to her COVID and some post COVID symptoms.  Also that she has not been eating very well with all of her GI issues.  She is worried that her exercise intolerance.  This is 1 reason why had thought the beta-blocker would not be a good idea for her, but we may end up using low-dose propranolol at least on an as needed standpoint.  Plan: Checking CPX (cardiopulmonary stress test)      Relevant Orders   ECHOCARDIOGRAM COMPLETE   Cardiopulmonary exercise test   Cardiac Stress Test: Informed Consent Details: Physician/Practitioner Attestation; Transcribe to consent form and obtain patient signature   COVID-19 long hauler manifesting chronic fatigue   Relevant Orders   ECHOCARDIOGRAM COMPLETE   Cardiopulmonary exercise test   Palpitations    Monitor showed short little bursts of SVT/PAT less than 20 beats per run.  We talked briefly about vagal maneuvers and staying adequately hydrated.  Would like to see the results of her CPX, but we could potentially consider PRN propranolol as opposed to standing dose due to concern with her fatigue issues..  This may also help her anxiety.       Relevant Orders   ECHOCARDIOGRAM COMPLETE   Cardiopulmonary exercise test   DOE (dyspnea on exertion) - Primary (Chronic)    I suspect that this is multifactorial.  Part of it is that she is somewhat deconditioned after her illness but COVID and her all of her GI issues.  With her  family history of CAD, she is very concerned and would like to reassure herself that this is not cardiac in  nature.  Plan: Check 2D echocardiogram and CPX      Relevant Orders   ECHOCARDIOGRAM COMPLETE   Cardiopulmonary exercise test   Cardiac Stress Test: Informed Consent Details: Physician/Practitioner Attestation; Transcribe to consent form and obtain patient signature   Family history of premature CAD (Chronic)    Reassuring coronary calcium score.  Low likelihood of a CAD, however now she is having worsening exertional dyspnea following COVID, we can reassess for ischemia and also potential pulmonary etiology.  Plan: Cardiopulmonary Stress Testing (CPX)  With relative normal coronary calcium score, currently not on aspirin or statin-not indicated.      Relevant Orders   ECHOCARDIOGRAM COMPLETE   Cardiopulmonary exercise test   Cardiac Stress Test: Informed Consent Details: Physician/Practitioner Attestation; Transcribe to consent form and obtain patient signature   Exercise intolerance (Chronic)    Associate with fatigue and exertional dyspnea.  Check CPX to evaluate exercise tolerance and potential ischemic/pulmonary abnormalities..      Relevant Orders   ECHOCARDIOGRAM COMPLETE   Cardiopulmonary exercise test   Cardiac Stress Test: Informed Consent Details: Physician/Practitioner Attestation; Transcribe to consent form and obtain patient signature    ===================================  HPI:    Dana [name] is a 60 y.o. female with a PMH with known family history of CAD and situational anxiety who presents today for ER follow-up with chest pain palpitations-monitor results.Dana [name] was last seen on November 22, 2020.  She had multiple issues.  Mostly GI.  Lots of it was related to post COVID issues (COVID was in early June) with fatigue and nausea/other GI symptoms.  She has had persistent GI symptoms where she has not been eating and drinking very well.  She was noting fatigue and dyspnea as well as nausea and generalized weakness.  Very anxious, concerned about  potential cardiac issues.  I basically reassured her.  Recent Hospitalizations:  01/07/2021 -[facility] ER concerns for elevated heart rate, and chest pain-> cardiology follow-up recommended  Reviewed  CV studies:    The following studies were reviewed today: (if available, images/films reviewed: From Epic Chart or Care Everywhere) 14-day Zio patch monitor:: Mostly sinus rhythm with a rate range of 53 to 137 bpm.  Average 78.  6 short bursts of SVT/PAT with fastest-7 beats '1 7 8 ' bpm, longest 14 beats 113 bpm.  No sustained arrhythmias.  Rare PACs and PVCs.  Some PAC couplets noted.  Diary episodes associate with sinus rhythm.  None with bursts of SVT or PAT.   Interval History:   Dana [name] returns today still not doing well after her COVID infection.  She is still weak and tired.  Still having the GI issues.  She said back on 6 August she started having some issues where her legs just felt totally weak and she started feeling more short of breath with exertional dyspnea and just generalized fatigue.  She still feels some palpitations but was happy to hear the results of her monitor.  She just very concerned about her exertional dyspnea and not sure if this is related to her cardiac issue or just post COVID.  She is gradually trying to increase her walking, has gone from 1/4 mile up to 1/2 mile and is hoping to get up to 65mle.  Does not necessarily notice any chest tightness or pressure while walking, but has been a little more short of breath than usual.  CV Review of Symptoms (Summary) Cardiovascular ROS: positive for - dyspnea on exertion, irregular heartbeat, palpitations, shortness of breath, and exercise intolerance, fatigue negative for - chest pain, edema, orthopnea, palpitations, rapid heart rate, or although she has had some lightheadedness and dizziness, no syncope or near syncope, no TIA or amaurosis fugax.  No claudication.  REVIEWED OF SYSTEMS   Review of Systems   Constitutional:  Positive for malaise/fatigue. Negative for weight loss.  HENT:  Negative for congestion and nosebleeds.   Respiratory:  Positive for shortness of breath. Negative for cough.   Cardiovascular:        Per HPI  Gastrointestinal:  Positive for nausea. Negative for abdominal pain, blood in stool and melena.  Genitourinary:  Negative for dysuria and hematuria.  Musculoskeletal:  Positive for myalgias. Negative for joint pain.       Legs ache  Neurological:  Positive for dizziness and weakness (Had sense of legs getting weak with walking). Negative for loss of consciousness.  Psychiatric/Behavioral:  Negative for depression and memory loss. The patient is nervous/anxious (Was seen for symptoms concerning for situational anxiety). The patient does not have insomnia.    I have reviewed and (if needed) personally updated the patient's problem list, medications, allergies, past medical and surgical history, social and family history.   PAST MEDICAL HISTORY   Past Medical History:  Diagnosis Date   Abnormality of gait 07/18/2015   Adhesive capsulitis of left shoulder 12/23/2017   Documented by her PCP and treated by JKatrine Coho  AR (allergic rhinitis) 11/08/2011  BPPV (benign paroxysmal positional vertigo) 07/31/2015   Chest pain, precordial 09/30/2017   Disturbance in sleep behavior 12/30/2017   DOE (dyspnea on exertion) 09/29/2017   Family history of early CAD 09/29/2017   GERD (gastroesophageal reflux disease)    Headache    Insomnia 11/08/2011   Leg length discrepancy 12/30/2017   Menopause 11/08/2011   2009    Migraine with aura and without status migrainosus, not intractable 07/31/2015   Nasal septal deviation 11/08/2011   Nonallopathic lesion of cervical region 11/03/2019   Sternocleidomastoid muscle tenderness 12/30/2017   Vertigo     PAST SURGICAL HISTORY   Past Surgical History:  Procedure Laterality Date   DILATION AND CURETTAGE OF UTERUS       Immunization History  Administered Date(s) Administered   Influenza Split 04/04/2012   Tdap 08/28/2015    MEDICATIONS/ALLERGIES   Current Meds  Medication Sig   cholecalciferol (VITAMIN D3) 25 MCG (1000 UNIT) tablet Take 2,000 Units by mouth daily.   dicyclomine (BENTYL) 20 MG tablet Take 1 tablet (20 mg total) by mouth every 12 (twelve) hours as needed (for abdominal pain/cramping).   famotidine (PEPCID) 20 MG tablet Take 20 mg by mouth 2 (two) times daily. Pt takes it qd-bid   meloxicam (MOBIC) 7.5 MG tablet Take 7.5 mg by mouth 2 (two) times daily as needed.   Multiple Vitamin (MULTIVITAMIN WITH MINERALS) TABS tablet Take 1 tablet by mouth daily.   ondansetron (ZOFRAN ODT) 4 MG disintegrating tablet Take 1 tablet (4 mg total) by mouth every 6 (six) hours as needed.   RESTASIS 0.05 % ophthalmic emulsion 1 drop 2 (two) times daily.   SUMAtriptan (IMITREX) 100 MG tablet Take 1 tablet earliest onset of migraine.  May repeat once in 2 hours if headache persists or recurs.  Do not exceed 2 tablets in 24 hours (Patient taking differently: Take 1 tablet earliest onset of migraine.  May repeat once in 2 hours if headache persists or recurs.  Do not exceed 2 tablets in 24 hours)    Allergies  Allergen Reactions   Prednisone     Feels like skin crawls    SOCIAL HISTORY/FAMILY HISTORY   Reviewed in Epic:  Pertinent findings:  Social History   Tobacco Use   Smoking status: Former   Smokeless tobacco: Never   Tobacco comments:    Smoked briefly while in [employer].  Vaping Use   Vaping Use: Never used  Substance Use Topics   Alcohol use: Yes    Comment: occasionally   Drug use: Never   Social History   Social History Narrative   Left handed   Lives with husband two story home    OBJCTIVE -PE, EKG, labs   Wt Readings from Last 3 Encounters:  02/06/21 131 lb 12.8 oz (59.8 kg)  01/30/21 133 lb (60.3 kg)  01/07/21 130 lb (59 kg)    Physical Exam: BP 120/90 (BP  Location: Left Arm)   Pulse 99   Ht '5\' 3"'  (1.6 m)   Wt 131 lb 12.8 oz (59.8 kg)   SpO2 98%   BMI 23.35 kg/m  Physical Exam Vitals reviewed.  Constitutional:      Appearance: Normal appearance. She is normal weight. She is not ill-appearing.     Comments: Healthy-appearing.  Well-groomed.  HENT:     Head: Normocephalic and atraumatic.  Neck:     Vascular: No carotid bruit or JVD.  Cardiovascular:     Rate and Rhythm: Normal rate and regular  rhythm. No extrasystoles are present.    Chest Wall: PMI is not displaced.     Pulses: Normal pulses.     Heart sounds: Normal heart sounds. No murmur heard.   No friction rub. No gallop.  Pulmonary:     Effort: Pulmonary effort is normal. No respiratory distress.     Breath sounds: Normal breath sounds. No wheezing, rhonchi or rales.  Chest:     Chest wall: No tenderness.  Musculoskeletal:        General: No swelling. Normal range of motion.     Cervical back: Normal range of motion and neck supple.  Skin:    General: Skin is warm and dry.  Neurological:     General: No focal deficit present.     Mental Status: She is alert and oriented to person, place, and time.  Psychiatric:        Mood and Affect: Mood normal.        Behavior: Behavior normal.        Thought Content: Thought content normal.        Judgment: Judgment normal.     Comments: Still somewhat anxious     Adult ECG Report   of yourNot checked  Recent Labs:   from [facility] to Comprehensive metabolic panel Component 82/50/53  12/13/20  11/11/20   Glucose 117 High  105 High  99  BUN '11 10 12  ' Creatinine 0.61 0.76 0.77  eGFR 102 90 88  BUN/Creatinine Ratio '18 13 16  ' Sodium 139 139 139  Potassium 3.8 4.1 4.0  Chloride 99 100 99  CO2 '26 26 23  ' CALCIUM 9.9 9.9 9.8  Total Protein 7.1 -- 7.2  Albumin, Serum 4.6 -- 4.2  Globulin, Total 2.5 -- 3.0  Albumin/Globulin Ratio 1.8 -- 1.4  Total Bilirubin 0.3 -- 0.3  Alkaline Phosphatase 74 -- 67  AST 17 --  22  ALT (SGPT) 15 -- 21  Magnesium 2.2      Lab Results  Component Value Date   CHOL 221 (H) 02/05/2015   HDL 80 02/05/2015   LDLCALC 126 02/05/2015   TRIG 77 02/05/2015   CHOLHDL 2.8 02/05/2015   Lab Results  Component Value Date   CREATININE 0.58 01/07/2021   BUN 12 01/07/2021   NA 137 01/07/2021   K 3.7 01/07/2021   CL 100 01/07/2021   CO2 29 01/07/2021   CBC Latest Ref Rng & Units 01/07/2021 03/31/2020 09/19/2018  WBC 4.0 - 10.5 K/uL 7.3 9.2 7.3  Hemoglobin 12.0 - 15.0 g/dL 13.7 14.7 14.8  Hematocrit 36.0 - 46.0 % 39.0 42.6 41.2  Platelets 150 - 400 K/uL 238 240 210    No results found for: HGBA1C Lab Results  Component Value Date   TSH 2.278 02/05/2015    ==================================================  COVID-19 Education: The signs and symptoms of COVID-19 were discussed with the patient and how to seek care for testing (follow up with PCP or arrange E-visit).    I spent a total of 28 minutes with the patient spent in direct patient consultation.  Additional time spent with chart review  / charting (studies, outside notes, etc): 16 min Total Time: 44 min  Current medicines are reviewed at length with the patient today.  (+/- concerns) none  This visit occurred during the SARS-CoV-2 public health emergency.  Safety protocols were in place, including screening questions prior to the visit, additional usage of staff PPE, and extensive cleaning of exam room while observing appropriate  contact time as indicated for disinfecting solutions.  Notice: This dictation was prepared with Dragon dictation along with smaller phrase technology. Any transcriptional errors that result from this process are unintentional and may not be corrected upon review.  Patient Instructions / Medication Changes & Studies & Tests Ordered   Shared Decision Making/Informed Consent{  The risks [chest pain, shortness of breath, cardiac arrhythmias, dizziness, blood pressure fluctuations,  myocardial infarction, stroke/transient ischemic attack, and life-threatening complications (estimated to be 1 in 10,000)], benefits (risk stratification, diagnosing coronary artery disease, treatment guidance) and alternatives of an exercise tolerance test were discussed in detail with Dana [name] and she agrees to proceed.    Patient Instructions  Medication Instructions:  Continue same medications  *If you need a refill on your cardiac medications before your next appointment, please call your pharmacy*   Lab Work: None ordered   Testing/Procedures:  Schedule CPX   Schedule Echo   Follow-Up: At [facility], you and your health needs are our priority.  As part of our continuing mission to provide you with exceptional heart care, we have created designated Provider Care Teams.  These Care Teams include your primary Cardiologist (physician) and Advanced Practice Providers (APPs -  Physician Assistants and Nurse Practitioners) who all work together to provide you with the care you need, when you need it.  We recommend signing up for the patient portal called "MyChart".  Sign up information is provided on this After Visit Summary.  MyChart is used to connect with patients for Virtual Visits (Telemedicine).  Patients are able to view lab/test results, encounter notes, upcoming appointments, etc.  Non-urgent messages can be sent to your provider as well.   To learn more about what you can do with MyChart, go to [email or web address].     Your next appointment: After test      The format for your next appointment: Office    Provider:  Dr.Trenna Kiely    Studies Ordered:   Orders Placed This Encounter  Procedures   Cardiopulmonary exercise test   Cardiac Stress Test: Informed Consent Details: Physician/Practitioner Attestation; Transcribe to consent form and obtain patient signature   ECHOCARDIOGRAM COMPLETE      Glenetta Hew, M.D., M.S. Interventional Cardiologist    Pager # [phone] Phone # [phone] [address] [place], [place] [address]   Thank you for choosing Heartcare at [facility]!!

## 2021-02-06 NOTE — Patient Instructions (Signed)
Medication Instructions:  Continue same medications  *If you need a refill on your cardiac medications before your next appointment, please call your pharmacy*   Lab Work: None ordered   Testing/Procedures:  Schedule CPX   Schedule Echo   Follow-Up: At [facility], you and your health needs are our priority.  As part of our continuing mission to provide you with exceptional heart care, we have created designated Provider Care Teams.  These Care Teams include your primary Cardiologist (physician) and Advanced Practice Providers (APPs -  Physician Assistants and Nurse Practitioners) who all work together to provide you with the care you need, when you need it.  We recommend signing up for the patient portal called "MyChart".  Sign up information is provided on this After Visit Summary.  MyChart is used to connect with patients for Virtual Visits (Telemedicine).  Patients are able to view lab/test results, encounter notes, upcoming appointments, etc.  Non-urgent messages can be sent to your provider as well.   To learn more about what you can do with MyChart, go to [email or web address].     Your next appointment: After test      The format for your next appointment: Office    Provider:  Dr.Harding

## 2021-02-07 ENCOUNTER — Telehealth (HOSPITAL_COMMUNITY): Payer: Self-pay | Admitting: *Deleted

## 2021-02-07 NOTE — Telephone Encounter (Signed)
Called pt to give appt date and time of CPX. No answer and no VM set up.

## 2021-02-08 ENCOUNTER — Encounter: Payer: Self-pay | Admitting: Cardiology

## 2021-02-08 NOTE — Assessment & Plan Note (Signed)
Probably related to her COVID and some post COVID symptoms.  Also that she has not been eating very well with all of her GI issues.  She is worried that her exercise intolerance.  This is 1 reason why had thought the beta-blocker would not be a good idea for her, but we may end up using low-dose propranolol at least on an as needed standpoint.  Plan: Checking CPX (cardiopulmonary stress test)

## 2021-02-08 NOTE — Assessment & Plan Note (Signed)
Reassuring coronary calcium score.  Low likelihood of a CAD, however now she is having worsening exertional dyspnea following COVID, we can reassess for ischemia and also potential pulmonary etiology.  Plan: Cardiopulmonary Stress Testing (CPX)  With relative normal coronary calcium score, currently not on aspirin or statin-not indicated.

## 2021-02-08 NOTE — Assessment & Plan Note (Signed)
Monitor showed short little bursts of SVT/PAT less than 20 beats per run.  We talked briefly about vagal maneuvers and staying adequately hydrated.  Would like to see the results of her CPX, but we could potentially consider PRN propranolol as opposed to standing dose due to concern with her fatigue issues..  This may also help her anxiety.

## 2021-02-08 NOTE — Assessment & Plan Note (Signed)
Associate with fatigue and exertional dyspnea.  Check CPX to evaluate exercise tolerance and potential ischemic/pulmonary abnormalities.Marland Kitchen

## 2021-02-08 NOTE — Assessment & Plan Note (Signed)
I suspect that this is multifactorial.  Part of it is that she is somewhat deconditioned after her illness but COVID and her all of her GI issues.  With her  family history of CAD, she is very concerned and would like to reassure herself that this is not cardiac in nature.  Plan: Check 2D echocardiogram and CPX

## 2021-02-11 ENCOUNTER — Encounter: Payer: Self-pay | Admitting: Cardiology

## 2021-02-11 ENCOUNTER — Telehealth: Payer: Self-pay | Admitting: Cardiology

## 2021-02-11 NOTE — Telephone Encounter (Signed)
Pt state she was reviewing her recent office note via mychart and notes it stated she was a former long term smoker. Pt state that is inaccurate and report she only smoke briefly for 1 month in high school.  Dr. Herbie Baltimore note from 02/06/21: Dana [name] is a 60 y.o. female with a PMH with known family history of CAD, and former long-term smoker with situational anxiety who presents today for ER follow-up with chest pain palpitations-monitor results..  Will forward to MD to make aware.

## 2021-02-11 NOTE — Telephone Encounter (Signed)
Patient states her office visit summary stated she was a former long term smoker, but that is not true. She would like this updated and called back when it is done.

## 2021-02-12 ENCOUNTER — Ambulatory Visit (INDEPENDENT_AMBULATORY_CARE_PROVIDER_SITE_OTHER): Payer: BC Managed Care – PPO

## 2021-02-12 ENCOUNTER — Other Ambulatory Visit: Payer: Self-pay

## 2021-02-12 DIAGNOSIS — R5382 Chronic fatigue, unspecified: Secondary | ICD-10-CM

## 2021-02-12 DIAGNOSIS — R0600 Dyspnea, unspecified: Secondary | ICD-10-CM | POA: Diagnosis not present

## 2021-02-12 DIAGNOSIS — U099 Post covid-19 condition, unspecified: Secondary | ICD-10-CM

## 2021-02-12 DIAGNOSIS — Z8249 Family history of ischemic heart disease and other diseases of the circulatory system: Secondary | ICD-10-CM

## 2021-02-12 DIAGNOSIS — G933 Postviral fatigue syndrome: Secondary | ICD-10-CM

## 2021-02-12 DIAGNOSIS — R6889 Other general symptoms and signs: Secondary | ICD-10-CM | POA: Diagnosis not present

## 2021-02-12 DIAGNOSIS — R002 Palpitations: Secondary | ICD-10-CM

## 2021-02-12 HISTORY — PX: TRANSTHORACIC ECHOCARDIOGRAM: SHX275

## 2021-02-12 LAB — ECHOCARDIOGRAM COMPLETE
Area-P 1/2: 3.85 cm2
S' Lateral: 2.37 cm

## 2021-02-12 NOTE — Telephone Encounter (Signed)
Pt updated and verbalized understanding.   Marykay Lex, MD  You 14 hours ago (9:46 PM)   I apologize - dictation error.   DH

## 2021-02-17 ENCOUNTER — Ambulatory Visit (HOSPITAL_COMMUNITY): Payer: BC Managed Care – PPO | Attending: Cardiology

## 2021-02-17 ENCOUNTER — Telehealth: Payer: Self-pay | Admitting: Cardiology

## 2021-02-17 ENCOUNTER — Other Ambulatory Visit: Payer: Self-pay

## 2021-02-17 DIAGNOSIS — R6889 Other general symptoms and signs: Secondary | ICD-10-CM | POA: Diagnosis not present

## 2021-02-17 DIAGNOSIS — R0600 Dyspnea, unspecified: Secondary | ICD-10-CM

## 2021-02-17 DIAGNOSIS — Z8249 Family history of ischemic heart disease and other diseases of the circulatory system: Secondary | ICD-10-CM | POA: Insufficient documentation

## 2021-02-17 DIAGNOSIS — R5382 Chronic fatigue, unspecified: Secondary | ICD-10-CM | POA: Diagnosis not present

## 2021-02-17 DIAGNOSIS — U099 Post covid-19 condition, unspecified: Secondary | ICD-10-CM | POA: Insufficient documentation

## 2021-02-17 DIAGNOSIS — R002 Palpitations: Secondary | ICD-10-CM | POA: Diagnosis not present

## 2021-02-17 HISTORY — PX: OTHER SURGICAL HISTORY: SHX169

## 2021-02-17 NOTE — Telephone Encounter (Signed)
Patient is requesting to speak with Dr. Elissa Hefty nurse regarding cardiopulmonary exercise test scheduled for this morning at 10:00 AM.

## 2021-02-17 NOTE — Telephone Encounter (Signed)
Returned call to patient who called with questions about the CPX she has scheduled at 1000 today. She is concerned about not sleeping well last night and having an elevated HR this morning. I advised that due to the current time that she should go ahead to [facility] and check in. I advised her to address her concerns with the staff that will be performing her test. She had questions about cancellation fees and I advised that I do not know the policy regarding that. She agrees to proceed to [facility] for testing and to address concerns once she arrives. She thanked me for the return call.

## 2021-03-02 ENCOUNTER — Emergency Department (HOSPITAL_BASED_OUTPATIENT_CLINIC_OR_DEPARTMENT_OTHER)
Admission: EM | Admit: 2021-03-02 | Discharge: 2021-03-02 | Disposition: A | Discharge status: Home / Self Care | Payer: BC Managed Care – PPO | Attending: Student | Admitting: Student

## 2021-03-02 ENCOUNTER — Other Ambulatory Visit: Payer: Self-pay

## 2021-03-02 DIAGNOSIS — R251 Tremor, unspecified: Secondary | ICD-10-CM | POA: Diagnosis not present

## 2021-03-02 DIAGNOSIS — R000 Tachycardia, unspecified: Secondary | ICD-10-CM | POA: Insufficient documentation

## 2021-03-02 DIAGNOSIS — Z8616 Personal history of COVID-19: Secondary | ICD-10-CM | POA: Diagnosis not present

## 2021-03-02 DIAGNOSIS — F410 Panic disorder [episodic paroxysmal anxiety] without agoraphobia: Secondary | ICD-10-CM | POA: Diagnosis not present

## 2021-03-02 DIAGNOSIS — Z87891 Personal history of nicotine dependence: Secondary | ICD-10-CM | POA: Insufficient documentation

## 2021-03-02 LAB — CBC WITH DIFFERENTIAL/PLATELET
Abs Immature Granulocytes: 0 10*3/uL (ref 0.00–0.07)
Basophils Absolute: 0.1 10*3/uL (ref 0.0–0.1)
Basophils Relative: 1 %
Eosinophils Absolute: 0 10*3/uL (ref 0.0–0.5)
Eosinophils Relative: 0 %
HCT: 38.8 % (ref 36.0–46.0)
Hemoglobin: 13.7 g/dL (ref 12.0–15.0)
Immature Granulocytes: 0 %
Lymphocytes Relative: 20 %
Lymphs Abs: 1.6 10*3/uL (ref 0.7–4.0)
MCH: 30.4 pg (ref 26.0–34.0)
MCHC: 35.3 g/dL (ref 30.0–36.0)
MCV: 86 fL (ref 80.0–100.0)
Monocytes Absolute: 0.4 10*3/uL (ref 0.1–1.0)
Monocytes Relative: 5 %
Neutro Abs: 5.9 10*3/uL (ref 1.7–7.7)
Neutrophils Relative %: 74 %
Platelets: 238 10*3/uL (ref 150–400)
RBC: 4.51 MIL/uL (ref 3.87–5.11)
RDW: 12.2 % (ref 11.5–15.5)
WBC: 8 10*3/uL (ref 4.0–10.5)
nRBC: 0 % (ref 0.0–0.2)

## 2021-03-02 LAB — COMPREHENSIVE METABOLIC PANEL
ALT: 11 U/L (ref 0–44)
AST: 15 U/L (ref 15–41)
Albumin: 4.4 g/dL (ref 3.5–5.0)
Alkaline Phosphatase: 47 U/L (ref 38–126)
Anion gap: 9 (ref 5–15)
BUN: 12 mg/dL (ref 6–20)
CO2: 26 mmol/L (ref 22–32)
Calcium: 9.5 mg/dL (ref 8.9–10.3)
Chloride: 102 mmol/L (ref 98–111)
Creatinine, Ser: 0.7 mg/dL (ref 0.44–1.00)
GFR, Estimated: >60 mL/min (ref >60)
Glucose, Bld: 106 mg/dL — ABNORMAL HIGH (ref 70–99)
Potassium: 3.7 mmol/L (ref 3.5–5.1)
Sodium: 137 mmol/L (ref 135–145)
Total Bilirubin: 0.5 mg/dL (ref 0.3–1.2)
Total Protein: 7.2 g/dL (ref 6.5–8.1)

## 2021-03-02 LAB — TROPONIN I (HIGH SENSITIVITY): Troponin I (High Sensitivity): 2 ng/L (ref <18)

## 2021-03-02 MED ORDER — LORAZEPAM 1 MG PO TABS: 1.0000 mg | ORAL_TABLET | Freq: Once | ORAL | Status: DC

## 2021-03-02 MED ORDER — LORAZEPAM 1 MG PO TABS
1.0000 mg | ORAL_TABLET | Freq: Once | ORAL | Status: AC
  Administered 2021-03-02: 1 mg via ORAL
  Filled 2021-03-02: qty 1

## 2021-03-02 MED ORDER — HYDROXYZINE HCL 25 MG PO TABS: 25.0000 mg | ORAL_TABLET | Freq: Four times a day (QID) | ORAL | 0 refills | Status: DC

## 2021-03-02 MED ORDER — LORAZEPAM 1 MG PO TABS: 0.5000 mg | ORAL_TABLET | Freq: Once | ORAL | Status: DC

## 2021-03-02 MED ORDER — LORAZEPAM 1 MG PO TABS: 1.0000 mg | ORAL_TABLET | Freq: Three times a day (TID) | ORAL | 0 refills | Status: DC | PRN

## 2021-03-02 NOTE — ED Provider Notes (Addendum)
[facility] EMERGENCY DEPT Provider Note   CSN: [id number] Arrival date & time: 03/02/21  0844     History Chief Complaint  Patient presents with   Panic Attack    Dana [name] is a 60 y.o. female who presents the emergency department for evaluation of tachycardia, palpitations and anxiety.  The patient has been following with her primary care PA closely and has been trialed on a variety of medications including low-dose lorazepam, Xanax ER, Wellbutrin.  She states that she has been unable to sleep as she wakes in the middle of the night and paces with feeling like her heart is racing.  She has had a recent stress test that showed small runs of SVT, but these were small enough that cardiology did not feel she needed to be on preventative medications.  She has had significant difficulty with symptoms including exertional shortness of breath and situational anxiety after a COVID diagnosis.  She arrives today with tachypnea, tachycardia, tremors and significant anxiety.  She denies chest pain, shortness of breath, abdominal pain, nausea, vomiting or other systemic symptoms.  She states that she has not taken any benzodiazepines in the last 24 hours.  HPI     Past Medical History:  Diagnosis Date   Abnormality of gait 07/18/2015   Adhesive capsulitis of left shoulder 12/23/2017   Documented by her PCP and treated by Sharen Hones   AR (allergic rhinitis) 11/08/2011   BPPV (benign paroxysmal positional vertigo) 07/31/2015   Chest pain, precordial 09/30/2017   Disturbance in sleep behavior 12/30/2017   DOE (dyspnea on exertion) 09/29/2017   Family history of early CAD 09/29/2017   GERD (gastroesophageal reflux disease)    Headache    Insomnia 11/08/2011   Leg length discrepancy 12/30/2017   Menopause 11/08/2011   2009    Migraine with aura and without status migrainosus, not intractable 07/31/2015   Nasal septal deviation 11/08/2011   Nonallopathic lesion of  cervical region 11/03/2019   Sternocleidomastoid muscle tenderness 12/30/2017   Vertigo     Patient Active Problem List   Diagnosis Date Noted   Exercise intolerance 02/06/2021   Fatigue 02/06/2021   COVID-19 long hauler manifesting chronic fatigue 02/06/2021   Palpitations 02/06/2021   Low back pain 03/14/2020   Patellofemoral syndrome of right knee 01/23/2020   Nonallopathic lesion of cervical region 11/03/2019   Sternocleidomastoid muscle tenderness 12/30/2017   Leg length discrepancy 12/30/2017   Disturbance in sleep behavior 12/30/2017   Neck pain 12/23/2017   Adhesive capsulitis of left shoulder 12/23/2017   Chest pain, precordial 09/30/2017   Pre-op testing 09/30/2017   DOE (dyspnea on exertion) 09/29/2017   Family history of premature CAD 09/29/2017   Pain of left heel 06/02/2016   BPPV (benign paroxysmal positional vertigo) 07/31/2015   Migraine with aura and without status migrainosus, not intractable 07/31/2015   Abnormality of gait 07/18/2015   Pain in joint, ankle and foot 06/20/2013   Menopause 11/08/2011   Nasal septal deviation 11/08/2011   Insomnia 11/08/2011   AR (allergic rhinitis) 11/08/2011    Past Surgical History:  Procedure Laterality Date   DILATION AND CURETTAGE OF UTERUS       OB History   No obstetric history on file.     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  Vaping Use   Vaping Use: Never used  Substance Use Topics   Alcohol use: Yes    Comment: occasionally   Drug use: Never    Home Medications Prior to Admission medications   Medication Sig Start Date End Date  Taking? Authorizing Provider  hydrOXYzine (ATARAX/VISTARIL) 25 MG tablet Take 1 tablet (25 mg total) by mouth every 6 (six) hours. 03/02/21  Yes Aymen Widrig, MD  LORazepam (ATIVAN) 1 MG tablet Take 1 tablet (1 mg total) by mouth 3 (three) times daily as needed (Panic attack). 03/02/21  Yes Josselyne Onofrio, MD  cholecalciferol (VITAMIN D3) 25 MCG (1000 UNIT) tablet Take 2,000 Units by mouth daily.    [provider]  dicyclomine (BENTYL) 20 MG tablet Take 1 tablet (20 mg total) by mouth every 12 (twelve) hours as needed (for abdominal pain/cramping). 09/19/18   Antony Madura, PA-C  famotidine (PEPCID) 20 MG tablet Take 20 mg by mouth 2 (two) times daily. Pt takes it qd-bid    [provider]  meloxicam (MOBIC) 7.5 MG tablet Take 7.5 mg by mouth 2 (two) times daily as needed. 03/01/20   [provider]  Multiple Vitamin (MULTIVITAMIN WITH MINERALS) TABS tablet Take 1 tablet by mouth daily.    [provider]  ondansetron (ZOFRAN ODT) 4 MG disintegrating tablet Take 1 tablet (4 mg total) by mouth every 6 (six) hours as needed. 04/22/18   Ward, Baxter Hire N, DO  RESTASIS 0.05 % ophthalmic emulsion 1 drop 2 (two) times daily. 01/08/21   [provider]  SUMAtriptan (IMITREX) 100 MG tablet Take 1 tablet earliest onset of migraine.  May repeat once in 2 hours if headache persists or recurs.  Do not exceed 2 tablets in 24 hours Patient taking differently: Take 1 tablet earliest onset of migraine.  May repeat once in 2 hours if headache persists or recurs.  Do not exceed 2 tablets in 24 hours 11/12/20   Drema Dallas, DO    Allergies    Prednisone  Review of Systems   Review of Systems  Constitutional:  Negative for chills and fever.  HENT:  Negative for ear pain and sore throat.   Eyes:  Negative for pain and visual disturbance.  Respiratory:  Negative for cough and shortness of breath.   Cardiovascular:  Positive for palpitations. Negative for chest pain.   Gastrointestinal:  Negative for abdominal pain and vomiting.  Genitourinary:  Negative for dysuria and hematuria.  Musculoskeletal:  Negative for arthralgias and back pain.  Skin:  Negative for color change and rash.  Neurological:  Negative for seizures and syncope.  Psychiatric/Behavioral:  The patient is nervous/anxious.   All other systems reviewed and are negative.  Physical Exam Updated Vital Signs BP 140/74 (BP Location: Right Arm)   Pulse (!) 101   Temp 98.3 F (36.8 C)   Resp (!) 23   SpO2 100%   Physical Exam Vitals and nursing note reviewed.  Constitutional:      General: She is in acute distress.     Appearance: She is well-developed.  HENT:     Head: Normocephalic and atraumatic.  Eyes:     Conjunctiva/sclera: Conjunctivae normal.  Cardiovascular:     Rate and  Rhythm: Regular rhythm. Tachycardia present.     Heart sounds: No murmur heard. Pulmonary:     Effort: Pulmonary effort is normal. No respiratory distress.     Breath sounds: Normal breath sounds.  Abdominal:     Palpations: Abdomen is soft.     Tenderness: There is no abdominal tenderness.  Musculoskeletal:     Cervical back: Neck supple.  Skin:    General: Skin is warm and dry.  Neurological:     Mental Status: She is alert and oriented to person, place, and time.  Psychiatric:     Comments: Tremulous, anxious    ED Results / Procedures / Treatments   Labs (all labs ordered are listed, but only abnormal results are displayed) Labs Reviewed  COMPREHENSIVE METABOLIC PANEL - Abnormal; Notable for the following components:      Result Value   Glucose, Bld 106 (*)    All other components within normal limits  CBC WITH DIFFERENTIAL/PLATELET  TROPONIN I (HIGH SENSITIVITY)    EKG EKG Interpretation  Date/Time:  Sunday March 02 2021 09:14:52 EDT Ventricular Rate:  111 PR Interval:  88 QRS Duration: 81 QT Interval:  326 QTC Calculation: 443 R Axis:   -59 Text Interpretation: Sinus  tachycardia no ST elevations or deppressions Confirmed by Martin Smeal ([id number]) on 03/02/2021 3:09:24 PM  Radiology No results found.  Procedures Procedures   Medications Ordered in ED Medications  LORazepam (ATIVAN) tablet 1 mg (1 mg Oral Given 03/02/21 0930)    ED Course  I have reviewed the triage vital signs and the nursing notes.  Pertinent labs & imaging results that were available during my care of the patient were reviewed by me and considered in my medical decision making (see chart for details).    MDM Rules/Calculators/A&P                           Patient seen the emergency department for evaluation of a panic attack.  Physical exam reveals an anxious appearing patient that is tremulous, tachycardic but showing no evidence of acute psychosis, and is linear and goal-directed in her speech.  Laboratory evaluation is unremarkable.  Patient given 1 mg p.o. Ativan and her symptoms resolved almost immediately.  I had a very long discussion with the patient about her underlying symptoms and I am concerned that the patient may be suffering from panic disorder or generalized anxiety disorder and she will most certainly need to be started on a baseline SSRI and connect with outpatient psychology and psychiatry.  The patient has a psychology appointment scheduled for tomorrow.  The patient and I used shared decision-making, and we decided that the SSRI will be left to her outpatient providers, and we will trial a course of Atarax and abortive Ativan for emergencies only.  Patient will follow up with her primary care physician and she was discharged. Final Clinical Impression(s) / ED Diagnoses Final diagnoses:  Panic attack    Rx / DC Orders ED Discharge Orders          Ordered    hydrOXYzine (ATARAX/VISTARIL) 25 MG tablet  Every 6 hours        03/02/21 1100    LORazepam (ATIVAN) 1 MG tablet  3 times daily PRN        10 /09/22 1119             Nithin Demeo, MD 03/02/21  1508    05/02/21, MD 03/02/21 [phone]

## 2021-03-02 NOTE — Discharge Instructions (Addendum)
You were seen in the emergency department for evaluation of a panic attack.  Your panic attack was addressed with 1 mg of oral Ativan and resolved nicely.  At this time, you are safe for discharge.  A prescription for Atarax has been sent to your pharmacy that I want you to take at night and as needed if you feel a panic attack is coming on.  If you break through this medication, Ativan has also been prescribed to your pharmacy and is to be used for emergencies only.  At this time you are safe for discharge.  Please return the emergency department if you have new or worsening chest pain, shortness of breath, fever or any other concerning symptoms.  Please follow-up with your primary care physician and your psychologist tomorrow.

## 2021-03-02 NOTE — ED Notes (Signed)
She is much more calm now. Her husband and daughter are with her.

## 2021-03-02 NOTE — ED Triage Notes (Signed)
She is manic in appearance. She continually speaks. The gist of her c/o is of anxiety since this Sep't. She has tried Buspar; Ativan, Clonazepam, and Ambien with no appreciable benefit. Her significant other (female) is with her. She has a host of

## 2021-03-12 ENCOUNTER — Encounter: Payer: Self-pay | Admitting: Cardiology

## 2021-03-12 ENCOUNTER — Ambulatory Visit: Payer: BC Managed Care – PPO | Admitting: Cardiology

## 2021-03-12 ENCOUNTER — Other Ambulatory Visit: Payer: Self-pay

## 2021-03-12 VITALS — BP 132/79 | HR 94 | Ht 63.0 in | Wt 133.2 lb

## 2021-03-12 DIAGNOSIS — R002 Palpitations: Secondary | ICD-10-CM

## 2021-03-12 DIAGNOSIS — R6889 Other general symptoms and signs: Secondary | ICD-10-CM | POA: Diagnosis not present

## 2021-03-12 DIAGNOSIS — R0609 Other forms of dyspnea: Secondary | ICD-10-CM

## 2021-03-12 NOTE — Progress Notes (Signed)
Primary Care Provider: Tracey Harries, MD Cardiologist: None Electrophysiologist: None  Clinic Note: Chief Complaint  Patient presents with   Hospitalization Follow-up    Recent ER visit for management of  panic attack    ===================================  ASSESSMENT/PLAN   Problem List Items Addressed This Visit     Palpitations - Primary    Short little bursts of PAT PSVT on her monitor at home with nothing overly worrisome.  1 consideration where she to continue to have these episodes consider a long-acting beta-blocker such as Toprol, nadolol or even potentially acebutolol.  It would potentially help with her palpitations but also for stress relief.  She wants to think having consideration but did not want to do add any new medicines at this point.      DOE (dyspnea on exertion) (Chronic)    She did pretty well on the CPX, unlikely to be a true cardiac or pulmonary issue.  Probably more related to deconditioning -> potentially part of the long-haul COVID symptoms.      Exercise intolerance (Chronic)    Getting better.  She is gradually picking up her exercise level.  This is 1 of these reasons why she does not really want to try a beta-blocker or calcium channel blocker.      ===================================  HPI:    Dana [name] is a 60 y.o. female with a PMH with known family history of CAD and situational anxiety who presents today for ER follow-up   Dana [name] has been seen several times in the last few months: July 1 -> noted multiple issues mostly were GI related and fatigue and nausea center following her COVID infection.  She would not eat or drinking well.  Felt generalized weakness anxiety.  She was reassured -> no clear-cut cardiac symptoms. August 16-[facility] -> chest pain and palpitations She was then seen on September 15 for ER visit follow-up: she was not doing well after COVID-19 infection (potentially considered to be  "long-hauler"..  Still with GI issues.  Noted leg fatigue and weakness.  Trying to increase her walking up from 1/4 mile to 1/2 mile and hoping to get to 1 mile.  Also still noticing chest pain issues. TTE & CPX ordered -relatively normal results.  Clearly, very reassuring.  Recent Hospitalizations:  03/02/2021: [facility]: Presented with palpitations, tachycardia and anxiety.  Had been following up with her PCP and was tried on several medication (Xanax, Wellbutrin, lorazepam).  Noted significant insomnia, new trouble by tachycardia.  There is concern for situational tachycardia and hypertension with anxiety. => SSx consistent with panic attack.-Reassured.  Was given a prescription for Atarax with Ativan for abortive care for emergencies.  Decided to defer SSRI to PCP.  Reviewed  CV studies:    The following studies were reviewed today: (if available, images/films reviewed: From Epic Chart or Care Everywhere) none  Interval History:   Dana [name] returns today in response to her panic attack.  She says it she still has her anxiety symptoms and the palpitations but realizes that is nothing prolonged and she has not really had any true syncope or near syncope.  She denies any recurrence of chest pain or pressure except when she is having her panic attacks.  Not having exertional symptoms and her dyspnea exertion is improving. CV Review of Symptoms (Summary) Cardiovascular ROS: positive for - dyspnea on exertion, irregular heartbeat, palpitations, and exercise intolerance, fatigue - off & on CP associated  with stress/anxiety negative for - edema, orthopnea, palpitations, rapid heart rate, shortness of breath, or although she has had some lightheadedness and dizziness, no syncope or near syncope, no TIA or amaurosis fugax.  No claudication.  REVIEWED OF SYSTEMS   Review of Systems  Constitutional:  Positive for malaise/fatigue (improving - was doing well until ER visit).  Negative for weight loss.  HENT:  Negative for congestion and nosebleeds.   Respiratory:  Positive for shortness of breath. Negative for cough.   Cardiovascular:  Negative for claudication.       Per HPI  Gastrointestinal:  Positive for nausea. Negative for abdominal pain, blood in stool and melena.  Genitourinary:  Negative for dysuria and hematuria.  Musculoskeletal:  Positive for myalgias. Negative for joint pain.       Legs ache  Neurological:  Positive for dizziness and weakness (Had sense of legs getting weak with walking). Negative for loss of consciousness.  Psychiatric/Behavioral:  Negative for depression and memory loss. The patient is nervous/anxious (situational anxiety) and has insomnia.    I have reviewed and (if needed) personally updated the patient's problem list, medications, allergies, past medical and surgical history, social and family history.   PAST MEDICAL HISTORY   Past Medical History:  Diagnosis Date   Abnormality of gait 07/18/2015   Adhesive capsulitis of left shoulder 12/23/2017   Documented by her PCP and treated by Sharen Hones   AR (allergic rhinitis) 11/08/2011   BPPV (benign paroxysmal positional vertigo) 07/31/2015   Chest pain, precordial 09/30/2017   Disturbance in sleep behavior 12/30/2017   DOE (dyspnea on exertion) 09/29/2017   Family history of early CAD 09/29/2017   GERD (gastroesophageal reflux disease)    Headache    Insomnia 11/08/2011   Leg length discrepancy 12/30/2017   Menopause 11/08/2011   2009    Migraine with aura and without status migrainosus, not intractable 07/31/2015   Nasal septal deviation 11/08/2011   Nonallopathic lesion of cervical region 11/03/2019   Sternocleidomastoid muscle tenderness 12/30/2017   Vertigo     PAST SURGICAL HISTORY   Past Surgical History:  Procedure Laterality Date   CARDIAC EVENT MONITOR  11/2020   Mostly SR - HR 53-137 bpm. 6 short burst of PAT/PSVT => fastest 7 beats - 179 bpm,  longest 14 beats @ 114 bpm.  Rare isolated PACs & PVCs  No sustained arrhythmia.   CARDIOPULMONARY STRESS TEST-CPX  02/17/2021   Excellent functional capacity compared to the norm.  No indication of cardiopulmonary abnormality.  Reaching ventilatory limits with mild and exercise hyperventilation is not a uncommon with highly motivated patients.   TRANSTHORACIC ECHOCARDIOGRAM  02/12/2021   EF 60 to 65%.  Normal RV.  Normal CVP    Immunization History  Administered Date(s) Administered   Influenza Split 04/04/2012   Tdap 08/28/2015    MEDICATIONS/ALLERGIES   Current Meds  Medication Sig   cholecalciferol (VITAMIN D3) 25 MCG (1000 UNIT) tablet Take 2,000 Units by mouth daily.   dicyclomine (BENTYL) 20 MG tablet Take 1 tablet (20 mg total) by mouth every 12 (twelve) hours as needed (for abdominal pain/cramping).   famotidine (PEPCID) 20 MG tablet Take 20 mg by mouth 2 (two) times daily. Pt takes it qd-bid   hydrOXYzine (ATARAX/VISTARIL) 25 MG tablet Take 1 tablet (25 mg total) by mouth every 6 (six) hours.   loratadine (CLARITIN) 10 MG tablet Take 10 mg by mouth daily.   LORazepam (ATIVAN) 0.5 MG tablet Take 0.5-1 mg by mouth 2 (  two) times daily as needed.   LORazepam (ATIVAN) 1 MG tablet Take 1 tablet (1 mg total) by mouth 3 (three) times daily as needed (Panic attack).   meloxicam (MOBIC) 7.5 MG tablet Take 7.5 mg by mouth 2 (two) times daily as needed.   Multiple Vitamin (MULTIVITAMIN WITH MINERALS) TABS tablet Take 1 tablet by mouth daily.   ondansetron (ZOFRAN ODT) 4 MG disintegrating tablet Take 1 tablet (4 mg total) by mouth every 6 (six) hours as needed.   RESTASIS 0.05 % ophthalmic emulsion 1 drop 2 (two) times daily.   sertraline (ZOLOFT) 25 MG tablet Take 25 mg by mouth daily.   SUMAtriptan (IMITREX) 100 MG tablet Take 1 tablet earliest onset of migraine.  May repeat once in 2 hours if headache persists or recurs.  Do not exceed 2 tablets in 24 hours (Patient taking differently:  Take 1 tablet earliest onset of migraine.  May repeat once in 2 hours if headache persists or recurs.  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  Vaping Use   Vaping Use: Never used  Substance Use Topics   Alcohol use: Yes    Comment: occasionally   Drug use: Never   Social History   Social History Narrative   Left handed   Lives with husband two story home    OBJCTIVE -PE, EKG, labs   Wt Readings from Last 3 Encounters:  03/12/21 133 lb 3.2 oz (60.4 kg)  02/06/21 131 lb 12.8 oz (59.8 kg)  01/30/21 133 lb (60.3 kg)    Physical Exam: BP 132/79   Pulse 94   Ht 5\' 3"  (1.6 m)   Wt 133 lb 3.2 oz (60.4 kg)   SpO2 98%   BMI 23.60 kg/m  Physical Exam Vitals reviewed.  Constitutional:      Appearance: Normal appearance. She is normal weight. She is not ill-appearing.     Comments: Healthy-appearing.  Well-groomed.  HENT:     Head: Normocephalic and atraumatic.  Neck:     Vascular: No carotid bruit or JVD.  Cardiovascular:     Rate and Rhythm: Normal rate and regular rhythm. No extrasystoles are present.    Chest Wall: PMI is not displaced.     Pulses: Normal pulses.     Heart sounds: Normal heart sounds. No murmur heard.   No friction rub. No gallop.  Pulmonary:     Effort: Pulmonary effort is normal. No respiratory distress.     Breath sounds: Normal breath sounds. No wheezing, rhonchi or rales.  Chest:     Chest wall: No tenderness.  Musculoskeletal:        General: No swelling. Normal range of motion.     Cervical back: Normal range of motion and neck supple.  Skin:    General: Skin is warm and dry.  Neurological:     General: No focal deficit present.     Mental  Status: She is alert and oriented to person, place, and time.  Psychiatric:        Mood and Affect: Mood normal.        Behavior: Behavior normal.  Thought Content: Thought content normal.        Judgment: Judgment normal.     Comments: Still somewhat anxious     Adult ECG Report   Not checked  Recent Labs: Lab Results  Component Value Date   CREATININE 0.70 03/02/2021   BUN 12 03/02/2021   NA 137 03/02/2021   K 3.7 03/02/2021   CL 102 03/02/2021   CO2 26 03/02/2021   CBC Latest Ref Rng & Units 03/02/2021 01/07/2021 03/31/2020  WBC 4.0 - 10.5 K/uL 8.0 7.3 9.2  Hemoglobin 12.0 - 15.0 g/dL 16.1 09.6 04.5  Hematocrit 36.0 - 46.0 % 38.8 39.0 42.6  Platelets 150 - 400 K/uL 238 238 240    No results found for: HGBA1C Lab Results  Component Value Date   TSH 2.278 02/05/2015    ==================================================  COVID-19 Education: The signs and symptoms of COVID-19 were discussed with the patient and how to seek care for testing (follow up with PCP or arrange E-visit).    I spent a total of 20 minutes with the patient spent in direct patient consultation.  Additional time spent with chart review  / charting (studies, outside notes, etc): 16 min Total Time: 36 min  Current medicines are reviewed at length with the patient today.  (+/- concerns) n/a  This visit occurred during the SARS-CoV-2 public health emergency.  Safety protocols were in place, including screening questions prior to the visit, additional usage of staff PPE, and extensive cleaning of exam room while observing appropriate contact time as indicated for disinfecting solutions.  Notice: This dictation was prepared with Dragon dictation along with smaller phrase technology. Any transcriptional errors that result from this process are unintentional and may not be corrected upon review.  .Studies Ordered:   No orders of the defined types were placed in this encounter.  Patient Instructions  / Medication Changes & Studies & Tests Ordered   Patient Instructions  Medication Instructions:   No changes *If you need a refill on your cardiac medications before your next appointment, please call your pharmacy*   Lab Work: Not needed If you have labs (blood work) drawn today and your tests are completely normal, you will receive your results only by: MyChart Message (if you have MyChart) OR A paper copy in the mail If you have any lab test that is abnormal or we need to change your treatment, we will call you to review the results.   Testing/Procedures:  Not needed  Follow-Up: At [facility], you and your health needs are our priority.  As part of our continuing mission to provide you with exceptional heart care, we have created designated Provider Care Teams.  These Care Teams include your primary Cardiologist (physician) and Advanced Practice Providers (APPs -  Physician Assistants and Nurse Practitioners) who all work together to provide you with the care you need, when you need it.     Your next appointment:   9 month(s)  July 2023  ( patient wanted to keep original recall date which is in July)  The format for your next appointment:   In Person  Provider:   Bryan Lemma, MD      Bryan Lemma, M.D., M.S. Interventional Cardiologist   Pager # [phone] Phone # [phone] [address] [place], [place] [address]   Thank you for choosing [facility] at [facility]!!

## 2021-03-12 NOTE — Patient Instructions (Addendum)
Medication Instructions:   No changes *If you need a refill on your cardiac medications before your next appointment, please call your pharmacy*   Lab Work: Not needed If you have labs (blood work) drawn today and your tests are completely normal, you will receive your results only by: MyChart Message (if you have MyChart) OR A paper copy in the mail If you have any lab test that is abnormal or we need to change your treatment, we will call you to review the results.   Testing/Procedures:  Not needed  Follow-Up: At [facility], you and your health needs are our priority.  As part of our continuing mission to provide you with exceptional heart care, we have created designated Provider Care Teams.  These Care Teams include your primary Cardiologist (physician) and Advanced Practice Providers (APPs -  Physician Assistants and Nurse Practitioners) who all work together to provide you with the care you need, when you need it.     Your next appointment:   9 month(s)  July 2023  ( patient wanted to keep original recall date which is in July)  The format for your next appointment:   In Person  Provider:   Bryan Lemma, MD

## 2021-04-12 ENCOUNTER — Encounter: Payer: Self-pay | Admitting: Cardiology

## 2021-04-12 NOTE — Assessment & Plan Note (Signed)
Getting better.  She is gradually picking up her exercise level.  This is 1 of these reasons why she does not really want to try a beta-blocker or calcium channel blocker.

## 2021-04-12 NOTE — Assessment & Plan Note (Signed)
Short little bursts of PAT PSVT on her monitor at home with nothing overly worrisome.  1 consideration where she to continue to have these episodes consider a long-acting beta-blocker such as Toprol, nadolol or even potentially acebutolol.  It would potentially help with her palpitations but also for stress relief.  She wants to think having consideration but did not want to do add any new medicines at this point.

## 2021-04-12 NOTE — Assessment & Plan Note (Signed)
She did pretty well on the CPX, unlikely to be a true cardiac or pulmonary issue.  Probably more related to deconditioning -> potentially part of the long-haul COVID symptoms.

## 2021-04-22 ENCOUNTER — Ambulatory Visit: Payer: BC Managed Care – PPO | Admitting: Family Medicine

## 2021-05-05 ENCOUNTER — Ambulatory Visit: Payer: BC Managed Care – PPO | Admitting: Neurology

## 2021-11-11 NOTE — Progress Notes (Unsigned)
NEUROLOGY FOLLOW UP OFFICE NOTE  Dana [name] [id number]  Assessment/Plan:   Migraine with aura, without status migrainosus, not intractable Bilateral carpal tunnel syndrome Vestibular migraine with concern for dizziness on upcoming cruise   1.  Migraine rescue:  Tylenol (sumatriptan 2nd line) 2.  Limit use of pain relievers to no more than 2 days out of week to prevent risk of rebound or medication-overuse headache. 3.  Keep headache diary 4.  Wrist splints. If no improvement in 2 months, will check NCV-EMG of upper extremities 5.  Scopolamine patch every 3 days while on cruise 6.  Otherwise, follow up one year or as needed.   Subjective:  Dana [name] is a 61 year old left-handed woman with generalized anxiety disorder who follows up for migraines.   UPDATE: She has had a couple of migraines since last year.  No vertigo.  Not needed sumatriptan.     Rescue therapy:  Tylenol first line; sumatriptan 100mg  second line (hasn't needed).  Diagnosed with carpal tunnel syndrome in 2011.  Symptoms resolved.  Gotten worse over past year.  Numbness in the hands sometimes making it difficult to grip.  Going on a cruise in summer and a little later in the year.  Given history of vertigo, concern for dizziness.     HISTORY: In 2006, she had a viral illness, in which she had syncope and developed diffuse weakness and paresthesias.  Neurologic workup and physical exam was normal.   I  MIGRAINES:  She started having migraines in young adulthood.  They usually occur at the back of her head.  Typically they are mild but can be a severe pounding ache.  When severe, they are accompanied by nausea and photophobia.  They are preceded by visual aura of zigzag lines.  She reports a prodrome of feeling strange.  They occur for the day but she does not take any pain reliever for it.  They occur once in a while, such as every 2 to several months.    There are no triggers.  Laying down to rest helps  relieve it.  Received OMM with Dr. 2007 for neck pain which has helped.    II  BPPV:  She has had approximately 3 episodes of vertigo, described as spinning.  In 2012, she woke up one morning with spinning sensation.  It would last a few seconds with head movement.  She also had nasal congestion.  She was treated with antibiotics, which were ineffective.  CT of her sinuses and MRI of brain with and without contrast from April 2012 were unremarkable.  She had a positive Dix-Hallpike and was diagnosed with BPPV.  She had another episode about a year later, which was severe and lasted a few seconds but afterwards she was nauseous and vomited.  MRI and MRA of head from 04/28/12 was unremarkable.  She had one other episode a few months ago, which quickly resolved.  She has been evaluated by ENT and has deviated septum to the right.    III  INTERMITTENT LEFT FACIAL TINGLING:  On a couple of occasions, she reports brief episodes of left facial tingling.  It occurred once in December 2016.  It was thought to be due to her neck.  It only lasts a day.  She denied neck pain, facial droop, headache or focal numbness and weakness.  They are not associated with her migraines.  PAST MEDICAL HISTORY: Past Medical History:  Diagnosis Date   Abnormality of gait 07/18/2015   Adhesive  capsulitis of left shoulder 12/23/2017   Documented by her PCP and treated by Sharen Hones   AR (allergic rhinitis) 11/08/2011   BPPV (benign paroxysmal positional vertigo) 07/31/2015   Chest pain, precordial 09/30/2017   Disturbance in sleep behavior 12/30/2017   DOE (dyspnea on exertion) 09/29/2017   Family history of early CAD 09/29/2017   GERD (gastroesophageal reflux disease)    Headache    Insomnia 11/08/2011   Leg length discrepancy 12/30/2017   Menopause 11/08/2011   2009    Migraine with aura and without status migrainosus, not intractable 07/31/2015   Nasal septal deviation 11/08/2011   Nonallopathic lesion of  cervical region 11/03/2019   Sternocleidomastoid muscle tenderness 12/30/2017   Vertigo     MEDICATIONS: Current Outpatient Medications on File Prior to Visit  Medication Sig Dispense Refill   cholecalciferol (VITAMIN D3) 25 MCG (1000 UNIT) tablet Take 2,000 Units by mouth daily.     dicyclomine (BENTYL) 20 MG tablet Take 1 tablet (20 mg total) by mouth every 12 (twelve) hours as needed (for abdominal pain/cramping). 15 tablet 0   famotidine (PEPCID) 20 MG tablet Take 20 mg by mouth 2 (two) times daily. Pt takes it qd-bid     hydrOXYzine (ATARAX/VISTARIL) 25 MG tablet Take 1 tablet (25 mg total) by mouth every 6 (six) hours. 12 tablet 0   loratadine (CLARITIN) 10 MG tablet Take 10 mg by mouth daily.     LORazepam (ATIVAN) 0.5 MG tablet Take 0.5-1 mg by mouth 2 (two) times daily as needed.     LORazepam (ATIVAN) 1 MG tablet Take 1 tablet (1 mg total) by mouth 3 (three) times daily as needed (Panic attack). 10 tablet 0   meloxicam (MOBIC) 7.5 MG tablet Take 7.5 mg by mouth 2 (two) times daily as needed.     Multiple Vitamin (MULTIVITAMIN WITH MINERALS) TABS tablet Take 1 tablet by mouth daily.     ondansetron (ZOFRAN ODT) 4 MG disintegrating tablet Take 1 tablet (4 mg total) by mouth every 6 (six) hours as needed. 20 tablet 0   RESTASIS 0.05 % ophthalmic emulsion 1 drop 2 (two) times daily.     sertraline (ZOLOFT) 25 MG tablet Take 25 mg by mouth daily.     SUMAtriptan (IMITREX) 100 MG tablet Take 1 tablet earliest onset of migraine.  May repeat once in 2 hours if headache persists or recurs.  Do not exceed 2 tablets in 24 hours (Patient taking differently: Take 1 tablet earliest onset of migraine.  May repeat once in 2 hours if headache persists or recurs.  Do not exceed 2 tablets in 24 hours) 10 tablet 5   No current facility-administered medications on file prior to visit.    ALLERGIES: Allergies  Allergen Reactions   Prednisone     Feels like skin crawls    FAMILY HISTORY: Family  History  Problem Relation Age of Onset   Hypertension Mother    Hypertension Father    Cancer Father    Diabetes Father    Coronary artery disease Father        History of CABG   Parkinsonism Paternal Grandfather    High Cholesterol Brother 42      Objective:  Blood pressure (!) 145/75, pulse 68, height 5\' 3"  (1.6 m), weight 140 lb 9.6 oz (63.8 kg), SpO2 100 %. General: No acute distress.  Patient appears well-groomed.   Head:  Normocephalic/atraumatic Eyes:  Fundi examined but not visualized Neck: supple, no paraspinal tenderness, full range  of motion Heart:  Regular rate and rhythm Lungs:  Clear to auscultation bilaterally Back: No paraspinal tenderness Neurological Exam: alert and oriented to person, place, and time.  Speech fluent and not dysarthric, language intact.  CN II-XII intact. Bulk and tone normal, muscle strength 5/5 throughout.  Sensation to light touch intact.  Deep tendon reflexes 2+ throughout, toes downgoing.  Finger to nose testing intact.  Gait normal, Romberg negative.   Shon Millet, DO  CC: Tracey Harries, MD

## 2021-11-12 ENCOUNTER — Encounter: Payer: Self-pay | Admitting: Neurology

## 2021-11-12 ENCOUNTER — Ambulatory Visit: Payer: BC Managed Care – PPO | Admitting: Neurology

## 2021-11-12 VITALS — BP 145/75 | HR 68 | Ht 63.0 in | Wt 140.6 lb

## 2021-11-12 DIAGNOSIS — G43109 Migraine with aura, not intractable, without status migrainosus: Secondary | ICD-10-CM

## 2021-11-12 DIAGNOSIS — G5603 Carpal tunnel syndrome, bilateral upper limbs: Secondary | ICD-10-CM

## 2021-11-12 DIAGNOSIS — R42 Dizziness and giddiness: Secondary | ICD-10-CM

## 2021-11-12 MED ORDER — SCOPOLAMINE 1 MG/3DAYS TD PT72: 1.0000 | MEDICATED_PATCH | TRANSDERMAL | 0 refills | Status: DC

## 2021-11-12 MED ORDER — SUMATRIPTAN SUCCINATE 100 MG PO TABS: ORAL_TABLET | ORAL | 5 refills | Status: DC

## 2021-11-12 NOTE — Patient Instructions (Signed)
Refilled sumatriptan Wear wrist splints every night and as much during day as possible.  If no improvement in 2 months, contact me and we can order nerve study Scopolamine patches ordered for the cruise - 1 every 72 hours (every 3 days) Otherwise, follow up one year

## 2022-01-02 ENCOUNTER — Encounter: Payer: Self-pay | Admitting: Neurology

## 2022-03-02 ENCOUNTER — Ambulatory Visit: Payer: BC Managed Care – PPO | Attending: Cardiology | Admitting: Cardiology

## 2022-03-02 ENCOUNTER — Encounter: Payer: Self-pay | Admitting: Cardiology

## 2022-03-02 VITALS — BP 132/82 | HR 75 | Ht 63.5 in | Wt 148.6 lb

## 2022-03-02 DIAGNOSIS — G9332 Myalgic encephalomyelitis/chronic fatigue syndrome: Secondary | ICD-10-CM

## 2022-03-02 DIAGNOSIS — R6889 Other general symptoms and signs: Secondary | ICD-10-CM | POA: Diagnosis not present

## 2022-03-02 DIAGNOSIS — G9331 Postviral fatigue syndrome: Secondary | ICD-10-CM

## 2022-03-02 DIAGNOSIS — U099 Post covid-19 condition, unspecified: Secondary | ICD-10-CM

## 2022-03-02 DIAGNOSIS — R0609 Other forms of dyspnea: Secondary | ICD-10-CM | POA: Diagnosis not present

## 2022-03-02 DIAGNOSIS — R072 Precordial pain: Secondary | ICD-10-CM | POA: Diagnosis not present

## 2022-03-02 DIAGNOSIS — Z8249 Family history of ischemic heart disease and other diseases of the circulatory system: Secondary | ICD-10-CM

## 2022-03-02 NOTE — Patient Instructions (Addendum)

## 2022-03-02 NOTE — Progress Notes (Signed)
Primary Care Provider: Tracey Harries, MD [facility] Cardiologist: Bryan Lemma, MD Electrophysiologist: None  Clinic Note: Chief Complaint  Patient presents with   Follow-up    Annual.  Overall feeling much better.  No major issues.   Palpitations    Pretty much resolved.  Rare palpitations now.  Not nearly as symptomatic.   ===================================  ASSESSMENT/PLAN   Problem List Items Addressed This Visit     COVID-19 long hauler manifesting chronic fatigue   Relevant Orders   EKG 12-Lead (Completed)   Fatigue    This seems to be getting better as her GI symptoms are improving.--Likely related to post-COVID symptoms. CPX was relatively reassuring.      Chest pain, precordial (Chronic)    Rare off-and-on chest discomfort.  Probably more related to epigastric pain from GI issues.  This is notably improved now.  Has been evaluated, essentially ischemic CAD ruled out..      Relevant Orders   EKG 12-Lead (Completed)   DOE (dyspnea on exertion) - Primary (Chronic)    Likely multifactorial, but she did well on CPX with normal echo.  Mostly now related to deconditioning.  She is doing much better now better exercise and feeling much improved.  No change.  Trying to avoid meds.  Try and avoid beta-blocker with her palpitations.      Relevant Orders   EKG 12-Lead (Completed)   Exercise intolerance (Chronic)    Getting better and more she exercises.  Should be safe from cardiac point of.  Try to avoid beta-blocker to avoid contributing calmness.      Relevant Orders   EKG 12-Lead (Completed)   Family history of premature CAD (Chronic)    Normal Coronary Calcium Score and relatively reassuring CPX study.  Overall low likelihood that her symptoms of either dyspnea on exertion or chest comfortable related to cardiac issues.    Continue respect modification with blood pressure and lipid management, per her choice, as below medicine burden as possible.       ===================================  HPI:    Dana [name] is a 61 y.o. female with a PMH below who presents today for annual follow-up for palpitations (PAT/PSVT), Exercise Intolerance/Dyspnea Exertion.  She continues to follow-up due to Family History of CAD  LEANNAH GUSE was last seen on 03/12/2021 as an ER follow-up.  She had multiple GI issues over the course of the summer 2022.  But also had chest pain palpitation episodes.  A lot of her symptoms are related to COVID-19 long-haul.  No significant panic attacks. => TTE and CPX ordered with reassuring results.  Suggested SSRI discussion with PCP.    Recent Hospitalizations: None  Reviewed  CV studies:    The following studies were reviewed today: (if available, images/films reviewed: From Epic Chart or Care Everywhere) None:  Interval History:   Dana [name] presents today overall doing a lot better.  She said last year was pretty rough year.  Stopped exricsing 2/2 COVID - ? Long COVID fatgue & SOB-but getting better with more activity.  Overall globally feeling much better.  Starting to get back and exercising => Finally back to waling 1-2 miles /day (up to 10K steps) per day. Deconditioned - exercise intolerance / fatigue & SOB - but no CP. Wgt gain b/c GI issues better & eating more with less exercise.  She had lost quite a bit of weight).  She is still not fully back to baseline but doing a whole lot  better than it was last year.  With the GI issues settling down she is eating better energy level is better.  She says the with this, her stress level is reduced.  She therefore is not noticing nearly the palpitations.  She still feels them off and on but they are rare and do not bother her.  Even more infrequent is a dizzy spell.  She is also really not noticing the same and exertional dyspnea and chest discomfort that she was having.  No resting chest pain or dyspnea.  No PND, orthopnea, or edema.  No rapid irregular  heartbeat/palpitations or tachyarrhythmias.  No syncope/near syncope or TIA/amaurosis fugax, claudication  REVIEWED OF SYSTEMS   Review of Systems  Constitutional:  Positive for malaise/fatigue (Still feels tired, but not nearly to the same extent.). Negative for weight loss (Finally put him back on weight).  HENT:  Negative for congestion and nosebleeds.   Respiratory:  Positive for shortness of breath (Gradually improving). Negative for cough and wheezing.   Cardiovascular:  Positive for palpitations (Less frequent). Negative for chest pain, orthopnea, claudication, leg swelling and PND.       Per HPI.  Gastrointestinal:  Negative for abdominal pain, blood in stool and melena.  Genitourinary:  Negative for dysuria, frequency and hematuria.  Musculoskeletal:  Positive for joint pain (Still bothered, but not much less so.  Carpal tunnel syndrome hurting) and myalgias (Much less prominent).  Neurological:  Negative for dizziness (Only if she is not eating and drinking well.), focal weakness and weakness.  Psychiatric/Behavioral:  Positive for depression (Stable). Negative for memory loss. The patient is nervous/anxious (Stable). The patient does not have insomnia (Sleeping much better.).     I have reviewed and (if needed) personally updated the patient's problem list, medications, allergies, past medical and surgical history, social and family history.   PAST MEDICAL HISTORY   Past Medical History:  Diagnosis Date   Abnormality of gait 07/18/2015   Adhesive capsulitis of left shoulder 12/23/2017   Documented by her PCP and treated by Sharen Hones   AR (allergic rhinitis) 11/08/2011   BPPV (benign paroxysmal positional vertigo) 07/31/2015   Chest pain, precordial 09/30/2017   Disturbance in sleep behavior 12/30/2017   DOE (dyspnea on exertion) 09/29/2017   Family history of early CAD 09/29/2017   GERD (gastroesophageal reflux disease)    Headache    Insomnia 11/08/2011   Leg  length discrepancy 12/30/2017   Menopause 11/08/2011   2009    Migraine with aura and without status migrainosus, not intractable 07/31/2015   Nasal septal deviation 11/08/2011   Nonallopathic lesion of cervical region 11/03/2019   Sternocleidomastoid muscle tenderness 12/30/2017   Vertigo     PAST SURGICAL HISTORY   Past Surgical History:  Procedure Laterality Date   CARDIAC EVENT MONITOR  11/2020   Mostly SR - HR 53-137 bpm. 6 short burst of PAT/PSVT => fastest 7 beats - 179 bpm, longest 14 beats @ 114 bpm.  Rare isolated PACs & PVCs  No sustained arrhythmia.   CARDIOPULMONARY STRESS TEST-CPX  02/17/2021   Excellent functional capacity compared to the norm.  No indication of cardiopulmonary abnormality.  Reaching ventilatory limits with mild and exercise hyperventilation is not a uncommon with highly motivated patients.   DILATION AND CURETTAGE OF UTERUS     TRANSTHORACIC ECHOCARDIOGRAM  02/12/2021   EF 60 to 65%.  Normal RV.  Normal CVP    Immunization History  Administered Date(s) Administered   Influenza Split 04/04/2012  Tdap 08/28/2015    MEDICATIONS/ALLERGIES   Current Meds  Medication Sig   cholecalciferol (VITAMIN D3) 25 MCG (1000 UNIT) tablet Take 2,000 Units by mouth daily.   loratadine (CLARITIN) 10 MG tablet Take 10 mg by mouth daily.   Multiple Vitamin (MULTIVITAMIN WITH MINERALS) TABS tablet Take 1 tablet by mouth daily.   RESTASIS 0.05 % ophthalmic emulsion 1 drop 2 (two) times daily.   scopolamine (TRANSDERM-SCOP) 1 MG/3DAYS Place 1 patch (1.5 mg total) onto the skin every 3 (three) days.   sertraline (ZOLOFT) 25 MG tablet Take 50 mg by mouth daily. 1.5 tabs daily   SUMAtriptan (IMITREX) 100 MG tablet Take 1 tablet earliest onset of migraine.  May repeat once in 2 hours if headache persists or recurs.  Do not exceed 2 tablets in 24 hours   Meds No Longer Taking:    [DISCONTINUED] dicyclomine (BENTYL) 20 MG tablet Take 1 tablet (20 mg total) by mouth every  12 (twelve) hours as needed (for abdominal pain/cramping).   [DISCONTINUED] famotidine (PEPCID) 20 MG tablet Take 20 mg by mouth 2 (two) times daily. Pt takes it qd-bid   [DISCONTINUED] LORazepam (ATIVAN) 0.5 MG tablet Take 0.5-1 mg by mouth 2 (two) times daily as needed.   [DISCONTINUED] LORazepam (ATIVAN) 1 MG tablet Take 1 tablet (1 mg total) by mouth 3 (three) times daily as needed (Panic attack).   [DISCONTINUED] meloxicam (MOBIC) 7.5 MG tablet Take 7.5 mg by mouth 2 (two) times daily as needed.   [DISCONTINUED] ondansetron (ZOFRAN ODT) 4 MG disintegrating tablet Take 1 tablet (4 mg total) by mouth every 6 (six) hours as needed.    Allergies  Allergen Reactions   Prednisone     Feels like skin crawls    SOCIAL HISTORY/FAMILY HISTORY   Reviewed in Epic:  Pertinent findings:  Social History   Tobacco Use   Smoking status: Former   Smokeless tobacco: Never   Tobacco comments:    Smoked briefly while in [employer].  Vaping Use   Vaping Use: Never used  Substance Use Topics   Alcohol use: Yes    Comment: occasionally   Drug use: Never   Social History   Social History Narrative   Left handed   Lives with husband two story home    OBJCTIVE -PE, EKG, labs   Wt Readings from Last 3 Encounters:  03/02/22 148 lb 9.6 oz (67.4 kg)  11/12/21 140 lb 9.6 oz (63.8 kg)  03/12/21 133 lb 3.2 oz (60.4 kg)    Physical Exam: BP 132/82   Pulse 75   Ht 5' 3.5" (1.613 m)   Wt 148 lb 9.6 oz (67.4 kg)   SpO2 99%   BMI 25.91 kg/m  Physical Exam Vitals reviewed.  Constitutional:      General: She is not in acute distress.    Appearance: Normal appearance. She is normal weight. She is not ill-appearing or toxic-appearing.     Comments: Well-nourished and well-groomed.  Healthy-appearing.  Seems to be feeling much better.  HENT:     Head: Normocephalic and atraumatic.  Eyes:     Extraocular Movements: Extraocular movements intact.     Pupils: Pupils are equal, round, and  reactive to light.  Neck:     Vascular: No carotid bruit or JVD.  Cardiovascular:     Rate and Rhythm: Normal rate and regular rhythm. No extrasystoles are present.    Chest Wall: PMI is not displaced.     Pulses: Normal pulses and intact  distal pulses.     Heart sounds: S1 normal and S2 normal. No murmur heard.    No friction rub. No gallop.  Pulmonary:     Effort: Pulmonary effort is normal. No respiratory distress.     Breath sounds: Normal breath sounds. No wheezing, rhonchi or rales.  Chest:     Chest wall: No tenderness.  Abdominal:     General: Bowel sounds are normal.     Palpations: Abdomen is soft.  Musculoskeletal:        General: No swelling. Normal range of motion.     Cervical back: Normal range of motion and neck supple.  Skin:    General: Skin is warm and dry.  Neurological:     General: No focal deficit present.     Mental Status: She is alert and oriented to person, place, and time. Mental status is at baseline.     Cranial Nerves: No cranial nerve deficit.     Gait: Gait normal.  Psychiatric:        Mood and Affect: Mood normal.        Behavior: Behavior normal.        Thought Content: Thought content normal.        Judgment: Judgment normal.     Adult ECG Report  Rate: 75;  Rhythm: normal sinus rhythm and normal axis, intervals and durations. ;   Narrative Interpretation: Normal  Recent Labs:  No recent labs  Lab Results  Component Value Date   CHOL 221 (H) 02/05/2015   HDL 80 02/05/2015   LDLCALC 126 02/05/2015   TRIG 77 02/05/2015   CHOLHDL 2.8 02/05/2015   Lab Results  Component Value Date   CREATININE 0.70 03/02/2021   BUN 12 03/02/2021   NA 137 03/02/2021   K 3.7 03/02/2021   CL 102 03/02/2021   CO2 26 03/02/2021      Latest Ref Rng & Units 03/02/2021   10:12 AM 01/07/2021   12:43 PM 03/31/2020    2:30 PM  CBC  WBC 4.0 - 10.5 K/uL 8.0  7.3  9.2   Hemoglobin 12.0 - 15.0 g/dL 13.7  13.7  14.7   Hematocrit 36.0 - 46.0 % 38.8  39.0   42.6   Platelets 150 - 400 K/uL 238  238  240     No results found for: "HGBA1C" Lab Results  Component Value Date   TSH 2.278 02/05/2015      ================================================== I spent a total of 15 minutes with the patient spent in direct patient consultation.  Additional time spent with chart review  / charting (studies, outside notes, etc): 16 min Total Time: 31 min  Current medicines are reviewed at length with the patient today.  (+/- concerns) n/a  Notice: This dictation was prepared with Dragon dictation along with smart phrase technology. Any transcriptional errors that result from this process are unintentional and may not be corrected upon review.  Studies Ordered:   Orders Placed This Encounter  Procedures   EKG 12-Lead   No orders of the defined types were placed in this encounter.   Patient Instructions / Medication Changes & Studies & Tests Ordered   Patient Instructions  Medication Instructions:  No changes *If you need a refill on your cardiac medications before your next appointment, please call your pharmacy*   Lab Work: Not needed   Testing/Procedures: Not needed   Follow-Up: At [facility], you and your health needs are our priority.  As part of  our continuing mission to provide you with exceptional heart care, we have created designated Provider Care Teams.  These Care Teams include your primary Cardiologist (physician) and Advanced Practice Providers (APPs -  Physician Assistants and Nurse Practitioners) who all work together to provide you with the care you need, when you need it.     Your next appointment:   12 month(s)  The format for your next appointment:   In Person  Provider:   Bryan Lemma, MD     Marykay Lex, MD, MS Bryan Lemma, M.D., M.S. Interventional Cardiologist  [facility]  Pager # [phone] Phone # [phone] [address] [place], [place]  [address]   Thank you for choosing [facility]!!

## 2022-03-22 NOTE — Progress Notes (Incomplete)
Primary Care Provider: Tracey Harries, MD [facility] Cardiologist: None Electrophysiologist: None  Clinic Note: No chief complaint on file.  ===================================  ASSESSMENT/PLAN   Problem List Items Addressed This Visit     COVID-19 long hauler manifesting chronic fatigue   Chest pain, precordial (Chronic)   DOE (dyspnea on exertion) - Primary (Chronic)   Exercise intolerance (Chronic)   ===================================  HPI:    Dana [name] is a 61 y.o. female with a PMH below who presents today for annual follow-up for palpitations (PAT/PSVT), Exercise Intolerance/Dyspnea Exertion.  She continues to follow-up due to Family History of CAD  Dana [name] was last seen on 03/12/2021 as an ER follow-up.  She had multiple GI issues over the course of the summer 2022.  But also had chest pain palpitation episodes.  A lot of her symptoms are related to COVID-19 long-haul.  No significant panic attacks. => TTE and CPX ordered with reassuring results.  Suggested SSRI discussion with PCP.    Recent Hospitalizations: None  Reviewed  CV studies:    The following studies were reviewed today: (if available, images/films reviewed: From Epic Chart or Care Everywhere) None:  Interval History:   Dana [name]   Stopped exricsing 2/2 COVID - ? Long COVID fatgue & SOB. Deconditioned - exercise intolerance / fatigue & SOB - but no CP. Wgt gain b/c GI issues better & eating more with less exercise.  Finally back to waling 1-2 miles /day (up to 10 steps)  CV Review of Symptoms (Summary): {roscv:310661}  REVIEWED OF SYSTEMS   ROS  Constitutional:  Positive for malaise/fatigue (improving - was doing well until ER visit). Negative for weight loss.  HENT:  Negative for congestion and nosebleeds.   Respiratory:  Positive for shortness of breath. Negative for cough.   Cardiovascular:  Negative for claudication.       Per HPI  Gastrointestinal:   Positive for nausea. Negative for abdominal pain, blood in stool and melena.  Genitourinary:  Negative for dysuria and hematuria.  Musculoskeletal:  Positive for myalgias. Negative for joint pain.       Legs ache  Neurological:  Positive for dizziness and weakness (Had sense of legs getting weak with walking). Negative for loss of consciousness.  Psychiatric/Behavioral:  Negative for depression and memory loss. The patient is nervous/anxious (situational anxiety) and has insomnia.    I have reviewed and (if needed) personally updated the patient's problem list, medications, allergies, past medical and surgical history, social and family history.   PAST MEDICAL HISTORY   Past Medical History:  Diagnosis Date  . Abnormality of gait 07/18/2015  . Adhesive capsulitis of left shoulder 12/23/2017   Documented by her PCP and treated by Sharen Hones  . AR (allergic rhinitis) 11/08/2011  . BPPV (benign paroxysmal positional vertigo) 07/31/2015  . Chest pain, precordial 09/30/2017  . Disturbance in sleep behavior 12/30/2017  . DOE (dyspnea on exertion) 09/29/2017  . Family history of early CAD 09/29/2017  . GERD (gastroesophageal reflux disease)   . Headache   . Insomnia 11/08/2011  . Leg length discrepancy 12/30/2017  . Menopause 11/08/2011   2009   . Migraine with aura and without status migrainosus, not intractable 07/31/2015  . Nasal septal deviation 11/08/2011  . Nonallopathic lesion of cervical region 11/03/2019  . Sternocleidomastoid muscle tenderness 12/30/2017  . Vertigo     PAST SURGICAL HISTORY   Past Surgical History:  Procedure Laterality Date  . CARDIAC EVENT MONITOR  11/2020  Mostly SR - HR 53-137 bpm. 6 short burst of PAT/PSVT => fastest 7 beats - 179 bpm, longest 14 beats @ 114 bpm.  Rare isolated PACs & PVCs  No sustained arrhythmia.  Marland Kitchen CARDIOPULMONARY STRESS TEST-CPX  02/17/2021   Excellent functional capacity compared to the norm.  No indication of  cardiopulmonary abnormality.  Reaching ventilatory limits with mild and exercise hyperventilation is not a uncommon with highly motivated patients.  Marland Kitchen DILATION AND CURETTAGE OF UTERUS    . TRANSTHORACIC ECHOCARDIOGRAM  02/12/2021   EF 60 to 65%.  Normal RV.  Normal CVP    Immunization History  Administered Date(s) Administered  . Influenza Split 04/04/2012  . Tdap 08/28/2015    MEDICATIONS/ALLERGIES   Current Meds  Medication Sig  . cholecalciferol (VITAMIN D3) 25 MCG (1000 UNIT) tablet Take 2,000 Units by mouth daily.  Marland Kitchen loratadine (CLARITIN) 10 MG tablet Take 10 mg by mouth daily.  . Multiple Vitamin (MULTIVITAMIN WITH MINERALS) TABS tablet Take 1 tablet by mouth daily.  . RESTASIS 0.05 % ophthalmic emulsion 1 drop 2 (two) times daily.  Marland Kitchen scopolamine (TRANSDERM-SCOP) 1 MG/3DAYS Place 1 patch (1.5 mg total) onto the skin every 3 (three) days.  Marland Kitchen sertraline (ZOLOFT) 25 MG tablet Take 50 mg by mouth daily. 1.5 tabs daily  . SUMAtriptan (IMITREX) 100 MG tablet Take 1 tablet earliest onset of migraine.  May repeat once in 2 hours if headache persists or recurs.  Do not exceed 2 tablets in 24 hours   Meds No Longer Taking:   . [DISCONTINUED] dicyclomine (BENTYL) 20 MG tablet Take 1 tablet (20 mg total) by mouth every 12 (twelve) hours as needed (for abdominal pain/cramping).  . [DISCONTINUED] famotidine (PEPCID) 20 MG tablet Take 20 mg by mouth 2 (two) times daily. Pt takes it qd-bid  . [DISCONTINUED] LORazepam (ATIVAN) 0.5 MG tablet Take 0.5-1 mg by mouth 2 (two) times daily as needed.  . [DISCONTINUED] LORazepam (ATIVAN) 1 MG tablet Take 1 tablet (1 mg total) by mouth 3 (three) times daily as needed (Panic attack).  . [DISCONTINUED] meloxicam (MOBIC) 7.5 MG tablet Take 7.5 mg by mouth 2 (two) times daily as needed.  . [DISCONTINUED] ondansetron (ZOFRAN ODT) 4 MG disintegrating tablet Take 1 tablet (4 mg total) by mouth every 6 (six) hours as needed.    Allergies  Allergen Reactions  .  Prednisone     Feels like skin crawls    SOCIAL HISTORY/FAMILY HISTORY   Reviewed in Epic:  Pertinent findings:  Social History   Tobacco Use  . Smoking status: Former  . Smokeless tobacco: Never  . Tobacco comments:    Smoked briefly while in highschool.  Vaping Use  . Vaping Use: Never used  Substance Use Topics  . Alcohol use: Yes    Comment: occasionally  . Drug use: Never   Social History   Social History Narrative   Left handed   Lives with husband two story home    OBJCTIVE -PE, EKG, labs   Wt Readings from Last 3 Encounters:  03/02/22 148 lb 9.6 oz (67.4 kg)  11/12/21 140 lb 9.6 oz (63.8 kg)  03/12/21 133 lb 3.2 oz (60.4 kg)    Physical Exam: BP 132/82   Pulse 75   Ht 5' 3.5" (1.613 m)   Wt 148 lb 9.6 oz (67.4 kg)   SpO2 99%   BMI 25.91 kg/m  Physical Exam Vitals reviewed.  Constitutional:      General: She is not in acute  distress.    Appearance: Normal appearance. She is normal weight. She is not ill-appearing or toxic-appearing.     Comments: Well-nourished and well-groomed.  Healthy-appearing.  Seems to be feeling much better.  HENT:     Head: Normocephalic and atraumatic.  Eyes:     Extraocular Movements: Extraocular movements intact.     Pupils: Pupils are equal, round, and reactive to light.  Neck:     Vascular: No carotid bruit or JVD.  Cardiovascular:     Rate and Rhythm: Normal rate and regular rhythm. No extrasystoles are present.    Chest Wall: PMI is not displaced.     Pulses: Normal pulses and intact distal pulses.     Heart sounds: S1 normal and S2 normal. No murmur heard.    No friction rub. No gallop.  Pulmonary:     Effort: Pulmonary effort is normal. No respiratory distress.     Breath sounds: Normal breath sounds. No wheezing, rhonchi or rales.  Chest:     Chest wall: No tenderness.  Abdominal:     General: Bowel sounds are normal.     Palpations: Abdomen is soft.  Musculoskeletal:        General: No swelling. Normal  range of motion.     Cervical back: Normal range of motion and neck supple.  Skin:    General: Skin is warm and dry.  Neurological:     General: No focal deficit present.     Mental Status: She is alert and oriented to person, place, and time. Mental status is at baseline.     Cranial Nerves: No cranial nerve deficit.     Gait: Gait normal.  Psychiatric:        Mood and Affect: Mood normal.        Behavior: Behavior normal.        Thought Content: Thought content normal.        Judgment: Judgment normal.     Adult ECG Report  Rate: *** ;  Rhythm: {rhythm:17366};   Narrative Interpretation: ***  Recent Labs:  ***  Lab Results  Component Value Date   CHOL 221 (H) 02/05/2015   HDL 80 02/05/2015   LDLCALC 126 02/05/2015   TRIG 77 02/05/2015   CHOLHDL 2.8 02/05/2015   Lab Results  Component Value Date   CREATININE 0.70 03/02/2021   BUN 12 03/02/2021   NA 137 03/02/2021   K 3.7 03/02/2021   CL 102 03/02/2021   CO2 26 03/02/2021      Latest Ref Rng & Units 03/02/2021   10:12 AM 01/07/2021   12:43 PM 03/31/2020    2:30 PM  CBC  WBC 4.0 - 10.5 K/uL 8.0  7.3  9.2   Hemoglobin 12.0 - 15.0 g/dL 13.7  13.7  14.7   Hematocrit 36.0 - 46.0 % 38.8  39.0  42.6   Platelets 150 - 400 K/uL 238  238  240     No results found for: "HGBA1C" Lab Results  Component Value Date   TSH 2.278 02/05/2015    ================================================== I spent a total of ***minutes with the patient spent in direct patient consultation.  Additional time spent with chart review  / charting (studies, outside notes, etc): *** min Total Time: *** min  Current medicines are reviewed at length with the patient today.  (+/- concerns) ***  Notice: This dictation was prepared with Dragon dictation along with smart phrase technology. Any transcriptional errors that result from this process are unintentional  and may not be corrected upon review.  Studies Ordered:   No orders of the defined  types were placed in this encounter.  No orders of the defined types were placed in this encounter.   Patient Instructions / Medication Changes & Studies & Tests Ordered   There are no Patient Instructions on file for this visit.     Marykay Lex, MD, MS Bryan Lemma, M.D., M.S. Interventional Cardiologist  [facility]  Pager # [phone] Phone # [phone] [address] [place], [place] [address]   Thank you for choosing [facility] [place]!!

## 2022-03-23 ENCOUNTER — Encounter: Payer: Self-pay | Admitting: Cardiology

## 2022-03-23 NOTE — Assessment & Plan Note (Addendum)
This seems to be getting better as her GI symptoms are improving.--Likely related to post-COVID symptoms. CPX was relatively reassuring.

## 2022-03-23 NOTE — Assessment & Plan Note (Signed)
Getting better and more she exercises.  Should be safe from cardiac point of.  Try to avoid beta-blocker to avoid contributing calmness.

## 2022-03-23 NOTE — Assessment & Plan Note (Signed)
Rare off-and-on chest discomfort.  Probably more related to epigastric pain from GI issues.  This is notably improved now.  Has been evaluated, essentially ischemic CAD ruled out.Dana [name]

## 2022-03-23 NOTE — Assessment & Plan Note (Signed)
Normal Coronary Calcium Score and relatively reassuring CPX study.  Overall low likelihood that her symptoms of either dyspnea on exertion or chest comfortable related to cardiac issues.    Continue respect modification with blood pressure and lipid management, per her choice, as below medicine burden as possible.

## 2022-03-23 NOTE — Assessment & Plan Note (Signed)
Likely multifactorial, but she did well on CPX with normal echo.  Mostly now related to deconditioning.  She is doing much better now better exercise and feeling much improved.  No change.  Trying to avoid meds.  Try and avoid beta-blocker with her palpitations.

## 2022-06-17 ENCOUNTER — Ambulatory Visit: Payer: BC Managed Care – PPO | Admitting: Cardiology

## 2022-11-05 ENCOUNTER — Ambulatory Visit (HOSPITAL_BASED_OUTPATIENT_CLINIC_OR_DEPARTMENT_OTHER): Payer: BC Managed Care – PPO | Admitting: Pulmonary Disease

## 2022-11-05 ENCOUNTER — Encounter (HOSPITAL_BASED_OUTPATIENT_CLINIC_OR_DEPARTMENT_OTHER): Payer: Self-pay | Admitting: Pulmonary Disease

## 2022-11-05 VITALS — BP 126/72 | HR 93 | Temp 98.4°F | Ht 63.0 in | Wt 158.4 lb

## 2022-11-05 DIAGNOSIS — R918 Other nonspecific abnormal finding of lung field: Secondary | ICD-10-CM | POA: Diagnosis not present

## 2022-11-05 NOTE — Progress Notes (Signed)
Subjective:   PATIENT ID: Dana [name] GENDER: female DOB: June 07, 1960, MRN: [id number]  Chief Complaint  Patient presents with   Consult    Consult. Patient here to talk about tracheal nodule.     Reason for Visit: New consult for abnormal CT scan  [name] is a 62 year old female never smoker with allergic rhinitis, GERD, hx BPPV, migraine, insomnia who presents for evaluation for CT scan.  She reports diagnosis of covid in May 2022 complicated by long hauler. Since then has not been able to pickleball. She has started walking again, going 3 miles a day. Denies fatigue, unintentional weight loss. Occasional night sweats. She does have cough and sore throat recently but thought this to be viral. No cough, shortness of breath or wheezing at baseline. She reports a history of abnormal CT scans since 2022 that have monitored a tracheal nodules and lung nodules. She was referred by PCP for additional Pulmonary work-up if indicated.  Social History: Never smoker  I have personally reviewed patient's past medical/family/social history, allergies, current medications.  Past Medical History:  Diagnosis Date   Abnormality of gait 07/18/2015   Adhesive capsulitis of left shoulder 12/23/2017   Documented by her PCP and treated by Sharen Hones   AR (allergic rhinitis) 11/08/2011   BPPV (benign paroxysmal positional vertigo) 07/31/2015   Chest pain, precordial 09/30/2017   Disturbance in sleep behavior 12/30/2017   DOE (dyspnea on exertion) 09/29/2017   Family history of early CAD 09/29/2017   GERD (gastroesophageal reflux disease)    Headache    Insomnia 11/08/2011   Leg length discrepancy 12/30/2017   Menopause 11/08/2011   2009    Migraine with aura and without status migrainosus, not intractable 07/31/2015   Nasal septal deviation 11/08/2011   Nonallopathic lesion of cervical region 11/03/2019   Sternocleidomastoid muscle tenderness 12/30/2017   Vertigo       Family History  Problem Relation Age of Onset   Hypertension Mother    Hypertension Father    Cancer Father    Diabetes Father    Coronary artery disease Father        History of CABG   Parkinsonism Paternal Grandfather    High Cholesterol Brother 55     Social History   Occupational History   Not on file  Tobacco Use   Smoking status: Never   Smokeless tobacco: Never   Tobacco comments:    Smoked briefly while in highschool.  Vaping Use   Vaping Use: Never used  Substance and Sexual Activity   Alcohol use: Yes    Comment: occasionally   Drug use: Never   Sexual activity: Not on file    Allergies  Allergen Reactions   Prednisone     Feels like skin crawls     Outpatient Medications Prior to Visit  Medication Sig Dispense Refill   cholecalciferol (VITAMIN D3) 25 MCG (1000 UNIT) tablet Take 2,000 Units by mouth daily.     loratadine (CLARITIN) 10 MG tablet Take 10 mg by mouth daily.     Multiple Vitamin (MULTIVITAMIN WITH MINERALS) TABS tablet Take 1 tablet by mouth daily.     RESTASIS 0.05 % ophthalmic emulsion 1 drop 2 (two) times daily.     scopolamine (TRANSDERM-SCOP) 1 MG/3DAYS Place 1 patch (1.5 mg total) onto the skin every 3 (three) days. 10 patch 0   sertraline (ZOLOFT) 25 MG tablet Take 50 mg by mouth daily. 1.5 tabs daily  SUMAtriptan (IMITREX) 100 MG tablet Take 1 tablet earliest onset of migraine.  May repeat once in 2 hours if headache persists or recurs.  Do not exceed 2 tablets in 24 hours 10 tablet 5   No facility-administered medications prior to visit.    Review of Systems  Constitutional:  Negative for chills, diaphoresis, fever, malaise/fatigue and weight loss.  HENT:  Positive for sore throat. Negative for congestion.   Respiratory:  Positive for cough. Negative for hemoptysis, sputum production, shortness of breath and wheezing.   Cardiovascular:  Negative for chest pain, palpitations and leg swelling.     Objective:   Vitals:    11/05/22 0936  BP: 126/72  Pulse: 93  Temp: 98.4 F (36.9 C)  TempSrc: Oral  SpO2: 98%  Weight: 158 lb 6.4 oz (71.8 kg)  Height: 5\' 3"  (1.6 m)   SpO2: 98 % O2 Device: None (Room air)  Physical Exam: General: Well-appearing, no acute distress HENT: [place], AT Eyes: EOMI, no scleral icterus Respiratory: Clear to auscultation bilaterally.  No crackles, wheezing or rales Cardiovascular: RRR, -M/R/G, no JVD Extremities:-Edema,-tenderness Neuro: AAO x4, CNII-XII grossly intact Psych: Normal mood, normal affect  Data Reviewed:  Imaging: CXR 01/07/21 - Hyperinflation  CTA 01/03/21 at [facility] (report only) - IMPRESSION: 1. No acute pulmonary embolus. 2. Nodularity about the mid and distal trachea is indeterminate. Polyps can have this appearance debris also a consideration. 3. There are a few linear nodular opacities in the peripheral lungs, the largest measures 6 mm in the right lower lobe.   CT Chest 07/09/21 at [facility] (report only) - The small nodules in the right lung, mostly subpleural are stable.  Per Fleischner Society guidelines, recommend follow-up CT chest at 18-24 months from the initial CT scan (January 03, 2021.)   Micronodular appearance along the anterior wall of the trachea is again noted and unchanged. This is of uncertain significance. Tracheal papillomatosis is within the differential diagnosis.  Other tracheal diseases which spare the posterior trachea can include relapsing polychondritis and tracheobronchopathia osteochondroplastica, though there is no associated wall thickening which is usually seen with these 2 entities.   CT Chest 07/10/22 at [facility] (report only) - Stable 6 mm and small pulmonary nodules. No new nodules. Micronodular appearance of the anterior trachea without significant change or associated soft tissue.  PFT: CPET 02/18/23 Excellent function capacity. No cardiopulmonary abnormality  Labs:    Latest Ref Rng & Units 03/02/2021   10:12 AM 01/07/2021    12:43 PM 03/31/2020    2:30 PM  CBC  WBC 4.0 - 10.5 K/uL 8.0  7.3  9.2   Hemoglobin 12.0 - 15.0 g/dL 40.9  81.1  91.4   Hematocrit 36.0 - 46.0 % 38.8  39.0  42.6   Platelets 150 - 400 K/uL 238  238  240    Normal blood counts    Assessment & Plan:   Discussion: 62 year old female never smoker with allergic rhinitis, GERD, hx BPPV, migraine, insomnia who presents for evaluation for CT scan. Extensively reviewed prior CT imaging reports. Patient overall asymptomatic with recent recovery from covid long hauler. Radiology comments regarding micronodular appearance along the anterior wall of the trachea ensure findings are stable without any associated inflammation. Suspect this is likely benign. Agree with annual surveillance of tracheal nodules and subcentimeter lung nodules <35mm.   Multiple lung nodules Tracheal micronodules - low suspicion for systemic process or active issue in absence of associated inflammation and absence of symptoms --ORDER CT chest  without contrast in 8 months (Feb 2025)  Health Maintenance Immunization History  Administered Date(s) Administered   Influenza Split 04/04/2012   Influenza,inj,Quad PF,6+ Mos 04/16/2014, 04/06/2015, 03/30/2017, 03/30/2017   Influenza,trivalent, recombinat, inj, PF 04/04/2012   Influenza-Unspecified 04/06/2015   PFIZER(Purple Top)SARS-COV-2 Vaccination 09/01/2019, 09/25/2019   Tdap 08/28/2015   CT Lung Screen - not qualified. Never smoker  Orders Placed This Encounter  Procedures   CT Chest Wo Contrast    Standing Status:   Future    Standing Expiration Date:   11/05/2023    Order Specific Question:   Preferred imaging location?    Answer:   [facility]  No orders of the defined types were placed in this encounter.   Return in about 8 months (around 07/08/2023) for after CT scan.  I have spent a total time of 45-minutes on the day of the appointment reviewing prior documentation, coordinating care and discussing  medical diagnosis and plan with the patient/family. Imaging, labs and tests included in this note have been reviewed and interpreted independently by me.  Alfons Sulkowski Mechele Collin, MD [facility] 11/05/2022 11:52 AM  Office Number [phone]

## 2022-11-05 NOTE — Patient Instructions (Signed)
Multiple lung nodules --ORDER CT chest without contrast in 8 months (Feb 2025)

## 2022-11-13 ENCOUNTER — Ambulatory Visit: Payer: BC Managed Care – PPO | Admitting: Neurology

## 2022-11-13 NOTE — Progress Notes (Signed)
NEUROLOGY FOLLOW UP OFFICE NOTE  Dana [name] [id number]  Assessment/Plan:   Migraine with aura, without status migrainosus, not intractable Bilateral carpal tunnel syndrome Vestibular migraine   1.  Migraine rescue:  Tylenol (sumatriptan 2nd line) *** 2.  Limit use of pain relievers to no more than 2 days out of week to prevent risk of rebound or medication-overuse headache. 3.  Keep headache diary 4.  Follow up ***   Subjective:  Dana [name] is a 62 year old left-handed woman with generalized anxiety disorder who follows up for migraines.   UPDATE: She has had a couple of migraines since last year.  No vertigo.  Not needed sumatriptan.     Rescue therapy:  Tylenol first line; sumatriptan 100mg  second line (hasn't needed).  Diagnosed with carpal tunnel syndrome in 2011.  Symptoms resolved.  Gotten worse over past year.  Numbness in the hands sometimes making it difficult to grip. ***      HISTORY: In 2006, she had a viral illness, in which she had syncope and developed diffuse weakness and paresthesias.  Neurologic workup and physical exam was normal.   I  MIGRAINES:  She started having migraines in young adulthood.  They usually occur at the back of her head.  Typically they are mild but can be a severe pounding ache.  When severe, they are accompanied by nausea and photophobia.  They are preceded by visual aura of zigzag lines.  She reports a prodrome of feeling strange.  They occur for the day but she does not take any pain reliever for it.  They occur once in a while, such as every 2 to several months.    There are no triggers.  Laying down to rest helps relieve it.  Received OMM with Dr. Katrinka Blazing for neck pain which has helped.    II  BPPV:  She has had approximately 3 episodes of vertigo, described as spinning.  In 2012, she woke up one morning with spinning sensation.  It would last a few seconds with head movement.  She also had nasal congestion.  She was treated with  antibiotics, which were ineffective.  CT of her sinuses and MRI of brain with and without contrast from April 2012 were unremarkable.  She had a positive Dix-Hallpike and was diagnosed with BPPV.  She had another episode about a year later, which was severe and lasted a few seconds but afterwards she was nauseous and vomited.  MRI and MRA of head from 04/28/12 was unremarkable.  She had one other episode a few months ago, which quickly resolved.  She has been evaluated by ENT and has deviated septum to the right.    III  INTERMITTENT LEFT FACIAL TINGLING:  On a couple of occasions, she reports brief episodes of left facial tingling.  It occurred once in December 2016.  It was thought to be due to her neck.  It only lasts a day.  She denied neck pain, facial droop, headache or focal numbness and weakness.  They are not associated with her migraines.  PAST MEDICAL HISTORY: Past Medical History:  Diagnosis Date   Abnormality of gait 07/18/2015   Adhesive capsulitis of left shoulder 12/23/2017   Documented by her PCP and treated by Sharen Hones   AR (allergic rhinitis) 11/08/2011   BPPV (benign paroxysmal positional vertigo) 07/31/2015   Chest pain, precordial 09/30/2017   Disturbance in sleep behavior 12/30/2017   DOE (dyspnea on exertion) 09/29/2017   Family history of early CAD  09/29/2017   GERD (gastroesophageal reflux disease)    Headache    Insomnia 11/08/2011   Leg length discrepancy 12/30/2017   Menopause 11/08/2011   2009    Migraine with aura and without status migrainosus, not intractable 07/31/2015   Nasal septal deviation 11/08/2011   Nonallopathic lesion of cervical region 11/03/2019   Sternocleidomastoid muscle tenderness 12/30/2017   Vertigo     MEDICATIONS: Current Outpatient Medications on File Prior to Visit  Medication Sig Dispense Refill   cholecalciferol (VITAMIN D3) 25 MCG (1000 UNIT) tablet Take 2,000 Units by mouth daily.     loratadine (CLARITIN) 10 MG tablet  Take 10 mg by mouth daily.     Multiple Vitamin (MULTIVITAMIN WITH MINERALS) TABS tablet Take 1 tablet by mouth daily.     RESTASIS 0.05 % ophthalmic emulsion 1 drop 2 (two) times daily.     scopolamine (TRANSDERM-SCOP) 1 MG/3DAYS Place 1 patch (1.5 mg total) onto the skin every 3 (three) days. 10 patch 0   sertraline (ZOLOFT) 25 MG tablet Take 50 mg by mouth daily. 1.5 tabs daily     SUMAtriptan (IMITREX) 100 MG tablet Take 1 tablet earliest onset of migraine.  May repeat once in 2 hours if headache persists or recurs.  Do not exceed 2 tablets in 24 hours 10 tablet 5   No current facility-administered medications on file prior to visit.    ALLERGIES: Allergies  Allergen Reactions   Prednisone     Feels like skin crawls    FAMILY HISTORY: Family History  Problem Relation Age of Onset   Hypertension Mother    Hypertension Father    Cancer Father    Diabetes Father    Coronary artery disease Father        History of CABG   Parkinsonism Paternal Grandfather    High Cholesterol Brother 42      Objective:  *** General: No acute distress.  Patient appears well-groomed.   Head:  Normocephalic/atraumatic Eyes:  Fundi examined but not visualized Neck: supple, no paraspinal tenderness, full range of motion Heart:  Regular rate and rhythm Neurological Exam: alert and oriented.  Speech fluent and not dysarthric, language intact.  CN II-XII intact. Bulk and tone normal, muscle strength 5/5 throughout.  Sensation to light touch intact.  Deep tendon reflexes 2+ throughout.  Finger to nose testing intact.  Gait normal, Romberg negative.   Shon Millet, DO  CC: Tracey Harries, MD

## 2022-11-17 ENCOUNTER — Ambulatory Visit: Payer: BC Managed Care – PPO | Admitting: Neurology

## 2022-11-17 ENCOUNTER — Encounter: Payer: Self-pay | Admitting: Neurology

## 2022-11-17 VITALS — BP 118/72 | HR 98 | Ht 63.0 in | Wt 155.0 lb

## 2022-11-17 DIAGNOSIS — G5603 Carpal tunnel syndrome, bilateral upper limbs: Secondary | ICD-10-CM | POA: Diagnosis not present

## 2022-11-17 DIAGNOSIS — G43109 Migraine with aura, not intractable, without status migrainosus: Secondary | ICD-10-CM | POA: Diagnosis not present

## 2022-11-17 MED ORDER — SUMATRIPTAN SUCCINATE 100 MG PO TABS: ORAL_TABLET | ORAL | 5 refills | Status: DC

## 2023-03-04 ENCOUNTER — Ambulatory Visit: Payer: BC Managed Care – PPO | Admitting: Cardiology

## 2023-05-20 ENCOUNTER — Encounter (HOSPITAL_BASED_OUTPATIENT_CLINIC_OR_DEPARTMENT_OTHER): Payer: Self-pay | Admitting: Emergency Medicine

## 2023-05-20 ENCOUNTER — Other Ambulatory Visit: Payer: Self-pay

## 2023-05-20 ENCOUNTER — Emergency Department (HOSPITAL_BASED_OUTPATIENT_CLINIC_OR_DEPARTMENT_OTHER): Payer: BC Managed Care – PPO

## 2023-05-20 ENCOUNTER — Emergency Department (HOSPITAL_BASED_OUTPATIENT_CLINIC_OR_DEPARTMENT_OTHER)
Admission: EM | Admit: 2023-05-20 | Discharge: 2023-05-20 | Disposition: A | Discharge status: Home / Self Care | Payer: BC Managed Care – PPO

## 2023-05-20 DIAGNOSIS — H539 Unspecified visual disturbance: Secondary | ICD-10-CM

## 2023-05-20 DIAGNOSIS — Z20822 Contact with and (suspected) exposure to covid-19: Secondary | ICD-10-CM | POA: Diagnosis not present

## 2023-05-20 DIAGNOSIS — D72829 Elevated white blood cell count, unspecified: Secondary | ICD-10-CM | POA: Diagnosis not present

## 2023-05-20 DIAGNOSIS — H538 Other visual disturbances: Secondary | ICD-10-CM | POA: Diagnosis present

## 2023-05-20 LAB — CBC
HCT: 37.4 % (ref 36.0–46.0)
Hemoglobin: 13.1 g/dL (ref 12.0–15.0)
MCH: 30.5 pg (ref 26.0–34.0)
MCHC: 35 g/dL (ref 30.0–36.0)
MCV: 87.2 fL (ref 80.0–100.0)
Platelets: 190 10*3/uL (ref 150–400)
RBC: 4.29 MIL/uL (ref 3.87–5.11)
RDW: 13 % (ref 11.5–15.5)
WBC: 12.4 10*3/uL — ABNORMAL HIGH (ref 4.0–10.5)
nRBC: 0 % (ref 0.0–0.2)

## 2023-05-20 LAB — BASIC METABOLIC PANEL
Anion gap: 7 (ref 5–15)
BUN: 17 mg/dL (ref 8–23)
CO2: 28 mmol/L (ref 22–32)
Calcium: 9.7 mg/dL (ref 8.9–10.3)
Chloride: 102 mmol/L (ref 98–111)
Creatinine, Ser: 0.82 mg/dL (ref 0.44–1.00)
GFR, Estimated: >60 mL/min (ref >60)
Glucose, Bld: 106 mg/dL — ABNORMAL HIGH (ref 70–99)
Potassium: 4 mmol/L (ref 3.5–5.1)
Sodium: 137 mmol/L (ref 135–145)

## 2023-05-20 LAB — RESP PANEL BY RT-PCR (RSV, FLU A&B, COVID)  RVPGX2
Influenza A by PCR: NEGATIVE
Influenza B by PCR: NEGATIVE
Resp Syncytial Virus by PCR: NEGATIVE
SARS Coronavirus 2 by RT PCR: NEGATIVE

## 2023-05-20 LAB — GROUP A STREP BY PCR: Group A Strep by PCR: NOT DETECTED

## 2023-05-20 MED ORDER — TETRACAINE HCL 0.5 % OP SOLN
1.0000 [drp] | Freq: Once | OPHTHALMIC | Status: AC
  Administered 2023-05-20: 1 [drp] via OPHTHALMIC
  Filled 2023-05-20: qty 4

## 2023-05-20 MED ORDER — PROCHLORPERAZINE EDISYLATE 10 MG/2ML IJ SOLN
5.0000 mg | Freq: Once | INTRAMUSCULAR | Status: AC
  Administered 2023-05-20: 5 mg via INTRAVENOUS
  Filled 2023-05-20: qty 2

## 2023-05-20 MED ORDER — DIPHENHYDRAMINE HCL 50 MG/ML IJ SOLN
12.5000 mg | Freq: Once | INTRAMUSCULAR | Status: AC
  Administered 2023-05-20: 12.5 mg via INTRAVENOUS
  Filled 2023-05-20: qty 1

## 2023-05-20 NOTE — ED Triage Notes (Signed)
Flashing lights and floater started last night Seen by PCP Dr Everlene Other concerned for detached retina  Some pressure discomfort in right eye, HA

## 2023-05-20 NOTE — ED Notes (Signed)
ED Provider at bedside. 

## 2023-05-20 NOTE — ED Notes (Signed)
Patient transported to CT 

## 2023-05-20 NOTE — Discharge Instructions (Signed)
Your workup today including your CT scan were unremarkable.  I did call and discussed your case with Dr. Sherryll Burger.  He would like to see you at 8 AM tomorrow morning.  Please do not eat or drink anything after midnight in case there is a large retinal detachment that requires any surgery.  Please return to the ER overnight for worsening symptoms.

## 2023-05-20 NOTE — ED Provider Notes (Signed)
[place] EMERGENCY DEPARTMENT AT [facility] Provider Note   CSN: [id number] Arrival date & time: 05/20/23  1653     History  Chief Complaint  Patient presents with   Eye Problem    Dana [name] is a 62 y.o. female.  62 year old female with past medical history of migraine headaches in the past presenting to the emergency department today with flashes and floaters in her right eye.  The patient states that she noticed this yesterday evening before going to bed.  She woke up this morning and it was little better but she was still noticing this sensation.  She states that she has been having some intermittent headaches now over the past few weeks to months.  She does have a history of migraine headaches.  Denies any similar symptoms with previous migraine headaches.  She states that she is having a mild, dull headache at this time.  Her symptoms resolved when she closes her left eye.  She denies any neck pain or stiffness.  Denies any cough.  She does report having a scratchy throat that has been going now for the past 2 days.  She went to her primary care provider and was subsequently sent to the ER for further evaluation today.   Eye Problem      Home Medications Prior to Admission medications   Medication Sig Start Date End Date Taking? Authorizing Provider  cholecalciferol (VITAMIN D3) 25 MCG (1000 UNIT) tablet Take 2,000 Units by mouth daily.    [provider]  loratadine (CLARITIN) 10 MG tablet Take 10 mg by mouth daily.    [provider]  Multiple Vitamin (MULTIVITAMIN WITH MINERALS) TABS tablet Take 1 tablet by mouth daily.    [provider]  RESTASIS 0.05 % ophthalmic emulsion 1 drop 2 (two) times daily. 01/08/21   [provider]  scopolamine (TRANSDERM-SCOP) 1 MG/3DAYS Place 1 patch (1.5 mg total) onto the skin every 3 (three) days. 11/12/21   Drema Dallas, DO  sertraline (ZOLOFT) 25 MG tablet Take 50 mg by mouth daily.  1.5 tabs daily 03/11/21   [provider]  SUMAtriptan (IMITREX) 100 MG tablet Take 1 tablet earliest onset of migraine.  May repeat once in 2 hours if headache persists or recurs.  Do not exceed 2 tablets in 24 hours 11/17/22   Drema Dallas, DO      Allergies    Prednisone    Review of Systems   Review of Systems  Eyes:  Positive for visual disturbance.  All other systems reviewed and are negative.   Physical Exam Updated Vital Signs BP 112/65   Pulse 77   Temp 99.3 F (37.4 C) (Oral)   Resp 18   Ht 5\' 3"  (1.6 m)   Wt 73.5 kg   SpO2 96%   BMI 28.70 kg/m  Physical Exam Vitals and nursing note reviewed.   Gen: NAD Eyes: PERRL, EOMI HEENT: no oropharyngeal swelling, the patient's bilateral tonsils are erythematous with no exudates or swelling noted Neck: trachea midline, no meningismus Resp: clear to auscultation bilaterally Card: RRR, no murmurs, rubs, or gallops Abd: nontender, nondistended Extremities: no calf tenderness, no edema Vascular: 2+ radial pulses bilaterally, 2+ DP pulses bilaterally Skin: no rashes Psyc: acting appropriately   ED Results / Procedures / Treatments   Labs (all labs ordered are listed, but only abnormal results are displayed) Labs Reviewed  CBC - Abnormal; Notable for the following components:      Result  Value   WBC 12.4 (*)    All other components within normal limits  BASIC METABOLIC PANEL - Abnormal; Notable for the following components:   Glucose, Bld 106 (*)    All other components within normal limits  RESP PANEL BY RT-PCR (RSV, FLU A&B, COVID)  RVPGX2  GROUP A STREP BY PCR    EKG None  Radiology CT Head Wo Contrast Result Date: 05/20/2023 CLINICAL DATA:  Headache, increasing frequency.  Visual disturbance. EXAM: CT HEAD WITHOUT CONTRAST TECHNIQUE: Contiguous axial images were obtained from the base of the skull through the vertex without intravenous contrast. RADIATION DOSE REDUCTION: This exam was performed  according to the departmental dose-optimization program which includes automated exposure control, adjustment of the mA and/or kV according to patient size and/or use of iterative reconstruction technique. COMPARISON:  MRI brain 04/28/2012 FINDINGS: Brain: The brainstem, cerebellum, cerebral peduncles, thalami, basal ganglia, basilar cisterns, and ventricular system appear within normal limits. No intracranial hemorrhage, mass lesion, or acute CVA. Vascular: Unremarkable Skull: Unremarkable Sinuses/Orbits: Mastoid air cells and visualized paranasal sinuses unremarkable. No obvious hyperdensity along the retina, although if visual disturbance suggests a retinal abnormality, careful funduscopic exam would be recommended. Other: No supplemental non-categorized findings. IMPRESSION: 1. No acute intracranial findings. 2. No obvious hyperdensity along the retina, although if visual disturbance suggests a retinal abnormality, careful funduscopic exam would be recommended. Electronically Signed   By: Gaylyn Rong M.D.   On: 05/20/2023 19:00    Procedures Procedures    Medications Ordered in ED Medications  tetracaine (PONTOCAINE) 0.5 % ophthalmic solution 1 drop (1 drop Right Eye Given 05/20/23 1858)  prochlorperazine (COMPAZINE) injection 5 mg (5 mg Intravenous Given 05/20/23 1904)  diphenhydrAMINE (BENADRYL) injection 12.5 mg (12.5 mg Intravenous Given 05/20/23 1904)    ED Course/ Medical Decision Making/ A&P                                 Medical Decision Making 62 year old female with past medical history of migraine headaches presenting to the emergency department today with concern for flashes and floaters in her right eye.  The patient was found to be febrile here on arrival.  She was just at her doctor's office and was apparently not fibrate febrile at that time.  She denies any symptoms other than a scratchy throat at this time.  I will obtain COVID and flu testing as well as a strep  screen on the patient here.  She does not have any meningismus or findings on exam consistent with meningitis or encephalitis at this time.  Obtain CT scan of the patient's head to evaluate for intracranial hemorrhage or mass lesion.  Will give the patient Compazine and Benadryl for headache in the event this is due to complex migraine.  I will have our nursing staff recheck the patient's temperature as she is relatively asymptomatic with this and just had a normal temperature at her doctor's office.  Will reevaluate for ultimate disposition.  The patient's labs are largely reassuring.  She does have a mild leukocytosis.  Her temperature was rechecked here and was within normal limits.  Unclear of clinical significance.  Viral testing and strep testing are negative.  The patient CT scan is unremarkable.  I did call and discussed her case with Dr. Clelia Croft who recommends having the patient follow-up at 8 AM tomorrow morning.  The patient's eye exam here shows an eye pressure of 12 using  the Tono-Pen.  I did perform a bedside ultrasound I do not appreciate a large retinal detachment at this time.  Amount and/or Complexity of Data Reviewed Labs: ordered. Radiology: ordered.  Risk Prescription drug management.           Final Clinical Impression(s) / ED Diagnoses Final diagnoses:  Visual disturbance    Rx / DC Orders ED Discharge Orders     None         Durwin Glaze, MD 05/20/23 2021

## 2023-05-20 NOTE — ED Notes (Signed)
 RN reviewed discharge instructions with pt. Pt verbalized understanding and had no further questions. VSS upon discharge.  

## 2023-05-24 ENCOUNTER — Telehealth (HOSPITAL_BASED_OUTPATIENT_CLINIC_OR_DEPARTMENT_OTHER): Payer: Self-pay | Admitting: Pulmonary Disease

## 2023-05-24 NOTE — Telephone Encounter (Signed)
PT has been scheduled for their CT appointment. I will call them to inform them of their F/U and CT.

## 2023-05-24 NOTE — Telephone Encounter (Signed)
Patient called in to make follow up appointment for Dr. Everardo All. Patient is needing a CT prior to follow up on 07/14/23.   Please advise

## 2023-06-07 ENCOUNTER — Encounter: Payer: Self-pay | Admitting: Cardiology

## 2023-06-07 ENCOUNTER — Ambulatory Visit: Payer: BC Managed Care – PPO | Attending: Cardiology | Admitting: Cardiology

## 2023-06-07 VITALS — BP 124/78 | HR 64 | Ht 63.0 in | Wt 162.8 lb

## 2023-06-07 DIAGNOSIS — R002 Palpitations: Secondary | ICD-10-CM

## 2023-06-07 DIAGNOSIS — Z8249 Family history of ischemic heart disease and other diseases of the circulatory system: Secondary | ICD-10-CM

## 2023-06-07 DIAGNOSIS — Z136 Encounter for screening for cardiovascular disorders: Secondary | ICD-10-CM

## 2023-06-07 NOTE — Progress Notes (Signed)
 Cardiology Office Note:  .   Date:  06/12/2023  ID:  Dana [name] Dana [name], DOB 01/10/1961, MRN [id number] PCP: Dana Dana [name]  [facility] Providers Cardiologist:  Dana Clay, MD     Chief Complaint  Patient presents with   Follow-up    Annual follow-up    Patient Profile: .     Dana [name] is a  63 y.o. female with a PMH notable for palpitations (brief runs of PAT), family history of CAD and exercise intolerance/DOE who presents here for delayed annual follow-up at the request of Weber, Dana LITTIE, PA-C.    Dana [name] was last seen on 03/02/2022 as an annual follow-up.  Unfortunately she had to stop exercise due to COVID and long COVID symptoms as well as exertional dyspnea but she was getting back to more activity.  Not yet back to baseline.  Still having some exertional dyspnea, myalgias malaise.  Less frequent PVCs/palpitations.  Having issues with carpal tunnel syndrome.  Stable depression/anxiety-sleeping better with less insomnia. => No medication changes.  Plan 1 year follow-up.  Subjective  Discussed the use of AI scribe software for clinical note transcription with the patient, who gave verbal consent to proceed.  History of Present Illness   The patient, with a history of palpitations and post-COVID syndrome, reports no recent episodes of palpitations or rapid heart rates. They deny any chest pain, pressure, or tightness, and no shortness of breath, even when lying flat. There is no reported swelling in the legs. The patient has been managing migraines, which occur sporadically, without medication, choosing to endure the discomfort.  Recently, the patient experienced a retinal issue where the gel in the back of the retina detached, but it did not result in a detached retina. This was confirmed in the emergency room and by a retina specialist. The patient also reports vertigo, for which scopolamine  patches were recommended but have not been used recently.  The  patient's recovery from COVID-19 has been slow, with lingering symptoms. They report that illnesses tend to persist longer than expected. The patient has been working with a trainer twice a week to regain strength and stamina but notes that weight gain has been a concern.  The patient also has a history of pulmonary issues. During testing for a blood clot post-COVID, markings were found on the chest. These are being monitored annually by a pulmonary specialist. The patient is due for a CT scan soon to ensure no changes have occurred.  The patient's cholesterol levels are monitored annually. Her LDL level was down to 126 from 147 last year - but back up to 1678. The patient hopes to further reduce this through diet and exercise. A coronary calcium score from 2021 was reported as zero, indicating low risk at the time. The patient is considering retesting in the future to monitor for any changes.     Cardiovascular ROS: no chest pain or dyspnea on exertion negative for - edema, irregular heartbeat, orthopnea, palpitations, paroxysmal nocturnal dyspnea, rapid heart rate, shortness of breath, or syncope or near syncope TIA or amaurosis fugax.  Only potential positive it is still having some fatigue with post-COVID syndrome.  ROS:  Review of Systems - Negative except symptoms noted in HPI.    Objective   -Not on any cardiac medications.  Studies Reviewed: SABRA   EKG Interpretation Date/Time:  Monday June 07 2023 11:37:55 EST Ventricular Rate:  64 PR Interval:  122 QRS Duration:  88 QT Interval:  430 QTC Calculation: 443 R Axis:   1  Text Interpretation: Normal sinus rhythm Normal ECG When compared with ECG of 02-Mar-2021 09:14, Rate slower Confirmed by Anner Lenis ([id number]) on 06/07/2023 11:42:38 AM    14-day Zio patch monitor (August 2022):   Predominant rhythm is sinus rhythm with a minimum heart rate 53 bpm, maximum 179 bpm with average heart rate 78.  6 short bursts of PAT/PSVT fastest  was 7 beats with intermittent V. tach, longest was 14 beats at a rate of 113 bpm.-No sustained arrhythmia.  Rare isolated PACs and PVCs. Very PAC couplets and triplets. No bigeminy or trigeminy.  Diary episodes are related to sinus rhythm. None of the bursts of PAT/PSVT were documented as symptomatic.  ECHO (9/262022):  LVEF 60-65%. No  RWMA. Normal RV with normal RVP-RAP. Normal Valves CPX (02/12/2021): Normal/excellent functional capacity when compared to matched controlled sedentary norms.  No evidence of CP abnormality.  Achieved ventilatory limit with mild and exercise hyperventilation-not uncommon.  Component 09/11/22 09/22/21 09/04/20  Cholesterol, Total 213 High  231 High  225 High   Triglycerides 55 60 86  HDL 77 74 73  VLDL Cholesterol Cal 10 10 15   LDL 126 High  147 High  137 High    LABS  (04/21/2023): Lipid panel: LDL 167; TG 127, HDL 51, LDl 93; Na 142, K 4.3, BUN 26, Cr 1.53, Glu  97  DIAGNOSTIC EKG: Normal Coronary calcium score: 0 (10/2019)  Risk Assessment/Calculations:         Physical Exam:   VS:  BP 124/78 (BP Location: Left Arm, Patient Position: Sitting, Cuff Size: Normal)   Pulse 64   Ht 5' 3 (1.6 m)   Wt 162 lb 12.8 oz (73.8 kg)   BMI 28.84 kg/m    Wt Readings from Last 3 Encounters:  06/07/23 162 lb 12.8 oz (73.8 kg)  05/20/23 162 lb (73.5 kg)  11/17/22 155 lb (70.3 kg)    GEN: Well nourished, well groomed; in no acute distress; healthy-appearing NECK: No JVD; No carotid bruits CARDIAC: Normal S1, S2; RRR, no murmurs, rubs, gallops RESPIRATORY:  Clear to auscultation without rales, wheezing or rhonchi ; nonlabored, good air movement. ABDOMEN: Soft, non-tender, non-distended EXTREMITIES:  No edema; No deformity     ASSESSMENT AND PLAN: .    Problem List Items Addressed This Visit     Family history of premature CAD - Primary (Chronic)   Coronary Calcium Score 0 with very reassuring CPX.  Low likelihood of having significant CAD.   CRF  modification-lipid management and BP control. LDL goal is less than 100 which she is doing well on no meds.   BP well-controlled.  -Discussed the importance of diet and exercise in managing cholesterol levels.  -Continue current diet and exercise regimen. Not currently on any medications.      Palpitations (Chronic)   Well-controlled.  No longer an issue for her.  Not on any medications.      Screening for heart disease   Coronary Calcium Score 06/13/2020; CPX very reassuring.  -Consider rechecking coronary calcium score at next visit to assess for any changes.      Relevant Orders   EKG 12-Lead (Completed)    Post-COVID-19 Syndrome Reports lingering effects after recovering from COVID-19, including decreased stamina and strength. No specific cardiac symptoms related to this condition were discussed. -Continue monitoring symptoms and report any changes.  Follow-Up: Return in about 1 year (around 06/06/2024) for Routine follow up with me.  Total  time spent: 17 min spent with patient +  14 min spent charting = 31 min I spent 31 minutes in the care of Dana [name] today including reviewing outside labs from [facility] (2 minutes), reviewing studies (3 minutes), face to face time discussing treatment options (17 minutes), reviewing records from previous notes and ER visits. (4 minutes), 5 minutes, and documenting in the encounter.     Signed, Dana MICAEL Clay, MD, MS Dana [name], M.D., M.S. Interventional Cardiologist  [facility]  Pager # [phone] Phone # [phone] [address] [place], [place] [address]

## 2023-06-07 NOTE — Patient Instructions (Signed)
 Medication Instructions:  NO changes *If you need a refill on your cardiac medications before your next appointment, please call your pharmacy*   Follow-Up: At [facility], you and your health needs are our priority.  As part of our continuing mission to provide you with exceptional heart care, we have created designated Provider Care Teams.  These Care Teams include your primary Cardiologist (physician) and Advanced Practice Providers (APPs -  Physician Assistants and Nurse Practitioners) who all work together to provide you with the care you need, when you need it.   Your next appointment:   12 month(s)  Provider:   Alm Clay, MD     Other Instructions Your physician discussed the importance of regular exercise and diet and recommended that you start or continue a regular exercise program for good health.

## 2023-06-12 ENCOUNTER — Encounter: Payer: Self-pay | Admitting: Cardiology

## 2023-06-12 NOTE — Assessment & Plan Note (Addendum)
Coronary Calcium Score 0 with very reassuring CPX.  Low likelihood of having significant CAD.   CRF modification-lipid management and BP control. LDL goal is less than 100 which she is doing well on no meds.   BP well-controlled.  -Discussed the importance of diet and exercise in managing cholesterol levels.  -Continue current diet and exercise regimen. Not currently on any medications.

## 2023-06-12 NOTE — Assessment & Plan Note (Signed)
Well-controlled.  No longer an issue for her.  Not on any medications.

## 2023-06-12 NOTE — Assessment & Plan Note (Signed)
Coronary Calcium Score 06/13/2020; CPX very reassuring.  -Consider rechecking coronary calcium score at next visit to assess for any changes.

## 2023-06-14 ENCOUNTER — Ambulatory Visit (HOSPITAL_BASED_OUTPATIENT_CLINIC_OR_DEPARTMENT_OTHER): Payer: BC Managed Care – PPO

## 2023-06-21 ENCOUNTER — Ambulatory Visit: Payer: BC Managed Care – PPO | Admitting: Cardiology

## 2023-07-02 ENCOUNTER — Ambulatory Visit
Admission: RE | Admit: 2023-07-02 | Discharge: 2023-07-02 | Disposition: A | Discharge status: Home / Self Care | Payer: BC Managed Care – PPO | Source: Ambulatory Visit | Attending: Pulmonary Disease

## 2023-07-02 ENCOUNTER — Telehealth (HOSPITAL_BASED_OUTPATIENT_CLINIC_OR_DEPARTMENT_OTHER): Payer: Self-pay | Admitting: Pulmonary Disease

## 2023-07-02 DIAGNOSIS — R918 Other nonspecific abnormal finding of lung field: Secondary | ICD-10-CM

## 2023-07-02 NOTE — Telephone Encounter (Signed)
 Patient notified that her pictures and report will be in Epic and sent to provider. She is ok having her CT appointment.

## 2023-07-02 NOTE — Telephone Encounter (Signed)
 Patient called stating she had a CT scan schedule for [facility] but they cancelled due to insurance denial. Patient is schedule for GI today at 3:20pm. Patient was adamant that Dr. Kassie wanted her to have it at [facility] not GI. Advised patient she may be able to request a disc of her CT images and bring them with her to the appointment possibly? Patient wants to know if she needs to reschedule  Please advise patient is having imaging done today at 3:20pm

## 2023-07-08 ENCOUNTER — Encounter (HOSPITAL_BASED_OUTPATIENT_CLINIC_OR_DEPARTMENT_OTHER): Payer: Self-pay | Admitting: Pulmonary Disease

## 2023-07-14 ENCOUNTER — Encounter (HOSPITAL_BASED_OUTPATIENT_CLINIC_OR_DEPARTMENT_OTHER): Payer: Self-pay | Admitting: Pulmonary Disease

## 2023-07-14 ENCOUNTER — Ambulatory Visit (HOSPITAL_BASED_OUTPATIENT_CLINIC_OR_DEPARTMENT_OTHER): Payer: BC Managed Care – PPO | Admitting: Pulmonary Disease

## 2023-07-14 VITALS — BP 110/68 | HR 90 | Ht 63.0 in | Wt 158.1 lb

## 2023-07-14 DIAGNOSIS — R918 Other nonspecific abnormal finding of lung field: Secondary | ICD-10-CM

## 2023-07-14 DIAGNOSIS — J398 Other specified diseases of upper respiratory tract: Secondary | ICD-10-CM

## 2023-07-14 NOTE — Progress Notes (Unsigned)
Subjective:   PATIENT ID: Dana [name] GENDER: female DOB: December 19, 1960, MRN: [id number]  Chief Complaint  Patient presents with   Follow-up    Pulmonary nodules of lung    Reason for Visit: Follow-up for abnormal CT scan  [name] is a 63 year old female never smoker with allergic rhinitis, GERD, hx BPPV, migraine, insomnia who presents for follow-up CT scan.  Initial consult She reports diagnosis of covid in May 2022 complicated by long hauler. Since then has not been able to pickleball. She has started walking again, going 3 miles a day. Denies fatigue, unintentional weight loss. Occasional night sweats. She does have cough and sore throat recently but thought this to be viral. No cough, shortness of breath or wheezing at baseline. She reports a history of abnormal CT scans since 2022 that have monitored a tracheal nodules and lung nodules. She was referred by PCP for additional Pulmonary work-up if indicated.  07/14/23 Since our last visit she reports that she remains asymptomatic from a respiratory standpoint. She is walking and working with a trainer two days a week. No limitations in activity. Able to go up and down the stairs. Planning for a cruise soon. Denies history cold sores. Occasional has bronchitis with sputum production.  Social History: Never smoker  Past Medical History:  Diagnosis Date   Abnormality of gait 07/18/2015   Adhesive capsulitis of left shoulder 12/23/2017   Documented by her PCP and treated by Dana [name]   AR (allergic rhinitis) 11/08/2011   BPPV (benign paroxysmal positional vertigo) 07/31/2015   Chest pain, precordial 09/30/2017   Disturbance in sleep behavior 12/30/2017   DOE (dyspnea on exertion) 09/29/2017   Family history of early CAD 09/29/2017   GERD (gastroesophageal reflux disease)    Headache    Insomnia 11/08/2011   Leg length discrepancy 12/30/2017   Menopause 11/08/2011   2009    Migraine with aura and without  status migrainosus, not intractable 07/31/2015   Nasal septal deviation 11/08/2011   Nonallopathic lesion of cervical region 11/03/2019   Sternocleidomastoid muscle tenderness 12/30/2017   Vertigo      Family History  Problem Relation Age of Onset   Hypertension Mother    Hypertension Father    Cancer Father    Diabetes Father    Coronary artery disease Father        History of CABG   Parkinsonism Paternal Grandfather    High Cholesterol Brother 9     Social History   Occupational History   Not on file  Tobacco Use   Smoking status: Never   Smokeless tobacco: Never   Tobacco comments:    Smoked briefly while in highschool.  Vaping Use   Vaping status: Never Used  Substance and Sexual Activity   Alcohol use: Yes    Comment: occasionally   Drug use: Never   Sexual activity: Not on file    Allergies  Allergen Reactions   Prednisone     Feels like skin crawls     Outpatient Medications Prior to Visit  Medication Sig Dispense Refill   cholecalciferol (VITAMIN D3) 25 MCG (1000 UNIT) tablet Take 2,000 Units by mouth daily.     loratadine (CLARITIN) 10 MG tablet Take 10 mg by mouth daily.     Multiple Vitamin (MULTIVITAMIN WITH MINERALS) TABS tablet Take 1 tablet by mouth daily.     RESTASIS 0.05 % ophthalmic emulsion 1 drop 2 (two) times daily.     scopolamine (  TRANSDERM-SCOP) 1 MG/3DAYS Place 1 patch (1.5 mg total) onto the skin every 3 (three) days. 10 patch 0   sertraline (ZOLOFT) 25 MG tablet Take 50 mg by mouth daily. 1.5 tabs daily     SUMAtriptan (IMITREX) 100 MG tablet Take 1 tablet earliest onset of migraine.  May repeat once in 2 hours if headache persists or recurs.  Do not exceed 2 tablets in 24 hours 10 tablet 5   No facility-administered medications prior to visit.    Review of Systems  Constitutional:  Negative for chills, diaphoresis, fever, malaise/fatigue and weight loss.  HENT:  Positive for sore throat. Negative for congestion.   Respiratory:   Positive for cough. Negative for hemoptysis, sputum production, shortness of breath and wheezing.   Cardiovascular:  Negative for chest pain, palpitations and leg swelling.     Objective:   Vitals:   07/14/23 0836  BP: 110/68  Pulse: 90  SpO2: 98%  Weight: 158 lb 1.6 oz (71.7 kg)  Height: 5\' 3"  (1.6 m)    SpO2: 98 % Physical Exam: General: Well-appearing, no acute distress HENT: Dana [name], AT Eyes: EOMI, no scleral icterus Respiratory: ***Clear to auscultation bilaterally.  No crackles, wheezing or rales Cardiovascular: RRR, -M/R/G, no JVD Extremities:-Edema,-tenderness Neuro: AAO x4, CNII-XII grossly intact Psych: Normal mood, normal affect  Data Reviewed:  Imaging: CXR 01/07/21 - Hyperinflation  CTA 01/03/21 at [facility] (report only) - IMPRESSION: 1. No acute pulmonary embolus. 2. Nodularity about the mid and distal trachea is indeterminate. Polyps can have this appearance debris also a consideration. 3. There are a few linear nodular opacities in the peripheral lungs, the largest measures 6 mm in the right lower lobe.   CT Chest 07/09/21 at [facility] (report only) - The small nodules in the right lung, mostly subpleural are stable.  Per Fleischner Society guidelines, recommend follow-up CT chest at 18-24 months from the initial CT scan (January 03, 2021.)   Micronodular appearance along the anterior wall of the trachea is again noted and unchanged. This is of uncertain significance. Tracheal papillomatosis is within the differential diagnosis.  Other tracheal diseases which spare the posterior trachea can include relapsing polychondritis and tracheobronchopathia osteochondroplastica, though there is no associated wall thickening which is usually seen with these 2 entities.   CT Chest 07/10/22 at [facility] (report only) - Stable 6 mm and small pulmonary nodules. No new nodules. Micronodular appearance of the anterior trachea without significant change or associated soft tissue.  CT Chest  07/02/23: Multiple subcentimeter pulmonary nodules bilaterally with largest 5 mm in RLL  PFT: CPET 02/18/23 Excellent function capacity. No cardiopulmonary abnormality   Labs:    Latest Ref Rng & Units 05/20/2023    6:35 PM 03/02/2021   10:12 AM 01/07/2021   12:43 PM  CBC  WBC 4.0 - 10.5 K/uL 12.4  8.0  7.3   Hemoglobin 12.0 - 15.0 g/dL 02.7  25.3  66.4   Hematocrit 36.0 - 46.0 % 37.4  38.8  39.0   Platelets 150 - 400 K/uL 190  238  238    Normal blood counts    Assessment & Plan:   Discussion: 63 year old female never smoker with allergic rhinitis, GERD, hx BPPV, migraine, insomnia who presents for evaluation for CT scan. Extensively reviewed prior CT imaging reports. Patient overall asymptomatic with recent recovery from covid long hauler. Radiology comments regarding micronodular appearance along the anterior wall of the trachea ensure findings are stable without any associated inflammation. Suspect this is likely  benign. Agree with annual surveillance of tracheal nodules and subcentimeter lung nodules <37mm.   Multiple lung nodules Tracheal micronodules - low suspicion for systemic process or active issue in absence of associated inflammation and absence of symptoms. Ddx include tracheal papillomatosis. Low suspicion for relapsing polychondritis. Exam without external cartilaginous involvement, join pain or rashes. Tracheobronchopathia osteochondroplastica  considered as well but less likely with lack of inflammation. With sparing of the posterior tracheal wall, doubt amyloid, sarcoid and lower suspicion for carcinomas.   --Reviewed CT Chest 06/2023. Stable subcentimeter lung nodules that don't require follow-up. Tracheal nodules previously reported to be stable but will obtain [facility] records to compare --ORDER labs: ESR, CRP, ANCA --Consider flexible bronchoscopy with moderate sedation vs referral to tertiary center --Will mychart discussion after speaking with [facility] pulmonary  physician   Health Maintenance Immunization History  Administered Date(s) Administered   Influenza Split 04/04/2012   Influenza,inj,Quad PF,6+ Mos 04/16/2014, 04/06/2015, 03/30/2017, 03/30/2017   Influenza,trivalent, recombinat, inj, PF 04/04/2012   Influenza-Unspecified 04/06/2015   PFIZER(Purple Top)SARS-COV-2 Vaccination 09/01/2019, 09/25/2019   Tdap 08/28/2015   CT Lung Screen - not qualified. Never smoker  Orders Placed This Encounter  Procedures   Sed Rate (ESR)   C-reactive protein   ANCA Screen Reflex Titer  No orders of the defined types were placed in this encounter.   No follow-ups on file. If not prior CT or procedure then will order annual CT scan at that visit  I have spent a total time of 45-minutes on the day of the appointment including chart review, data review, collecting history, coordinating care and discussing medical diagnosis and plan with the patient/family. Past medical history, allergies, medications were reviewed. Pertinent imaging, labs and tests included in this note have been reviewed and interpreted independently by me.  Lynze Reddy Mechele Collin, MD [facility] 07/14/2023 9:15 AM

## 2023-07-14 NOTE — Patient Instructions (Addendum)
--  Reviewed CT Chest 06/2023. Stable subcentimeter lung nodules that don't require follow-up. Tracheal nodules previously reported to be stable but will obtain [facility] records to compare --ORDER labs: ESR, CRP, ANCA --Consider flexible bronchoscopy with moderate sedation vs referral to tertiary center --Will mychart discussion after speaking with [facility] pulmonary physician

## 2023-07-15 ENCOUNTER — Encounter (HOSPITAL_BASED_OUTPATIENT_CLINIC_OR_DEPARTMENT_OTHER): Payer: Self-pay | Admitting: Pulmonary Disease

## 2023-07-15 LAB — C-REACTIVE PROTEIN: CRP: 2 mg/L (ref 0–10)

## 2023-07-15 LAB — SEDIMENTATION RATE: Sed Rate: 13 mm/h (ref 0–40)

## 2023-11-17 NOTE — Progress Notes (Unsigned)
 NEUROLOGY FOLLOW UP OFFICE NOTE  Dana [name] [id number]  Assessment/Plan:   Migraine with aura, without status migrainosus, not intractable Bilateral carpal tunnel syndrome Vestibular migraine   1.  Migraine rescue:  Tylenol  (sumatriptan  2nd line)  2.  Limit use of pain relievers to no more than 2 days out of week to prevent risk of rebound or medication-overuse headache. 3.  Keep headache diary 4.  Follow up one year   Subjective:  Dana [name] is a 63 year old left-handed woman with generalized anxiety disorder who follows up for migraines.   UPDATE: She has had a couple of headaches since last year.  No vertigo.  Uses Tylenol .  Not needed sumatriptan .    She went on a couple of cruises and didn't need the scopolamine  patch.  Carpal tunnel syndrome overall controlled.  She has seen the head specialist and had injections for right thumb trigger finger.  If it occurs again, plan is to have surgery for that as well as for the carpal tunnel syndrome.   Rescue therapy:  Tylenol  first line; sumatriptan  100mg  second line (hasn't needed).     HISTORY: In 2006, she had a viral illness, in which she had syncope and developed diffuse weakness and paresthesias.  Neurologic workup and physical exam was normal.   MIGRAINES:   She started having migraines in young adulthood.  They usually occur at the back of her head.  Typically they are mild but can be a severe pounding ache.  When severe, they are accompanied by nausea and photophobia.  They are preceded by visual aura of zigzag lines.  She reports a prodrome of feeling strange.  They occur for the day but she does not take any pain reliever for it.  They occur once in a while, such as every 2 to several months.    There are no triggers.  Laying down to rest helps relieve it.  Received OMM with Dr. Claudene for neck pain which has helped.    BPPV:   She has had approximately 3 episodes of vertigo, described as spinning.  In 2012, she  woke up one morning with spinning sensation.  It would last a few seconds with head movement.  She also had nasal congestion.  She was treated with antibiotics, which were ineffective.  CT of her sinuses and MRI of brain with and without contrast from April 2012 were unremarkable.  She had a positive Dix-Hallpike and was diagnosed with BPPV.  She had another episode about a year later, which was severe and lasted a few seconds but afterwards she was nauseous and vomited.  MRI and MRA of head from 04/28/12 was unremarkable.  She had one other episode a few months ago, which quickly resolved.  She has been evaluated by ENT and has deviated septum to the right.    INTERMITTENT LEFT FACIAL TINGLING:   On a couple of occasions, she reports brief episodes of left facial tingling.  It occurred once in December 2016.  It was thought to be due to her neck.  It only lasts a day.  She denied neck pain, facial droop, headache or focal numbness and weakness.  They are not associated with her migraines.  BILATERAL CARPAL TUNNEL SYNDROME: Diagnosed with carpal tunnel syndrome in 2011.  Still with residual numbness intermittently, which are not significant.  Braces help. Uses braces which help.  She developed a trigger thumb on the right.  Injections helpful but returns.    PAST MEDICAL HISTORY: Past  Medical History:  Diagnosis Date   Abnormality of gait 07/18/2015   Adhesive capsulitis of left shoulder 12/23/2017   Documented by her PCP and treated by Norleen Arthur   AR (allergic rhinitis) 11/08/2011   BPPV (benign paroxysmal positional vertigo) 07/31/2015   Chest pain, precordial 09/30/2017   Disturbance in sleep behavior 12/30/2017   DOE (dyspnea on exertion) 09/29/2017   Family history of early CAD 09/29/2017   GERD (gastroesophageal reflux disease)    Headache    Insomnia 11/08/2011   Leg length discrepancy 12/30/2017   Menopause 11/08/2011   2009    Migraine with aura and without status migrainosus,  not intractable 07/31/2015   Nasal septal deviation 11/08/2011   Nonallopathic lesion of cervical region 11/03/2019   Sternocleidomastoid muscle tenderness 12/30/2017   Vertigo     MEDICATIONS: Current Outpatient Medications on File Prior to Visit  Medication Sig Dispense Refill   cholecalciferol (VITAMIN D3) 25 MCG (1000 UNIT) tablet Take 2,000 Units by mouth daily.     loratadine (CLARITIN) 10 MG tablet Take 10 mg by mouth daily.     Multiple Vitamin (MULTIVITAMIN WITH MINERALS) TABS tablet Take 1 tablet by mouth daily.     RESTASIS 0.05 % ophthalmic emulsion 1 drop 2 (two) times daily.     scopolamine  (TRANSDERM-SCOP) 1 MG/3DAYS Place 1 patch (1.5 mg total) onto the skin every 3 (three) days. 10 patch 0   sertraline (ZOLOFT) 25 MG tablet Take 50 mg by mouth daily. 1.5 tabs daily     SUMAtriptan  (IMITREX ) 100 MG tablet Take 1 tablet earliest onset of migraine.  May repeat once in 2 hours if headache persists or recurs.  Do not exceed 2 tablets in 24 hours 10 tablet 5   No current facility-administered medications on file prior to visit.    ALLERGIES: Allergies  Allergen Reactions   Prednisone     Feels like skin crawls    FAMILY HISTORY: Family History  Problem Relation Age of Onset   Hypertension Mother    Hypertension Father    Cancer Father    Diabetes Father    Coronary artery disease Father        History of CABG   Parkinsonism Paternal Grandfather    High Cholesterol Brother 42      Objective:  Blood pressure 120/68, pulse 68, height 5' 3 (1.6 m), weight 147 lb (66.7 kg), SpO2 99%. General: No acute distress.  Patient appears well-groomed.   Head:  Normocephalic/atraumatic Neck:  Supple.  No paraspinal tenderness.  Full range of motion. Heart:  Regular rate and rhythm. Neuro:  Alert and oriented.  Speech fluent and not dysarthric.  Language intact.  CN II-XII intact.  Bulk and tone normal.  Muscle strength 5/5 throughout.  Sensation to light touch intact.   Deep tendon reflexes 2+ throughout, toes downgoing.  Gait normal.  Romberg negative.    Juliene Dunnings, DO  CC: Lauraine Patient, PA-C

## 2023-11-18 ENCOUNTER — Encounter: Payer: Self-pay | Admitting: Neurology

## 2023-11-18 ENCOUNTER — Ambulatory Visit: Payer: BC Managed Care – PPO | Admitting: Neurology

## 2023-11-18 VITALS — BP 120/68 | HR 68 | Ht 63.0 in | Wt 147.0 lb

## 2023-11-18 DIAGNOSIS — G43809 Other migraine, not intractable, without status migrainosus: Secondary | ICD-10-CM

## 2023-11-18 DIAGNOSIS — G5603 Carpal tunnel syndrome, bilateral upper limbs: Secondary | ICD-10-CM

## 2023-11-18 DIAGNOSIS — G43109 Migraine with aura, not intractable, without status migrainosus: Secondary | ICD-10-CM

## 2023-11-18 MED ORDER — SUMATRIPTAN SUCCINATE 100 MG PO TABS: ORAL_TABLET | ORAL | 5 refills | Status: AC

## 2023-11-18 NOTE — Patient Instructions (Signed)
 Sumatriptan as needed

## 2023-11-25 ENCOUNTER — Other Ambulatory Visit (HOSPITAL_BASED_OUTPATIENT_CLINIC_OR_DEPARTMENT_OTHER): Payer: Self-pay | Admitting: Physician Assistant

## 2023-11-25 DIAGNOSIS — E7800 Pure hypercholesterolemia, unspecified: Secondary | ICD-10-CM

## 2023-12-13 ENCOUNTER — Ambulatory Visit (HOSPITAL_BASED_OUTPATIENT_CLINIC_OR_DEPARTMENT_OTHER)
Admission: RE | Admit: 2023-12-13 | Discharge: 2023-12-13 | Disposition: A | Discharge status: Home / Self Care | Payer: Self-pay | Source: Ambulatory Visit | Attending: Physician Assistant | Admitting: Physician Assistant

## 2023-12-13 DIAGNOSIS — E7800 Pure hypercholesterolemia, unspecified: Secondary | ICD-10-CM | POA: Insufficient documentation

## 2024-03-31 ENCOUNTER — Encounter (HOSPITAL_BASED_OUTPATIENT_CLINIC_OR_DEPARTMENT_OTHER): Payer: Self-pay | Admitting: Pulmonary Disease

## 2024-03-31 ENCOUNTER — Ambulatory Visit (INDEPENDENT_AMBULATORY_CARE_PROVIDER_SITE_OTHER): Admitting: Pulmonary Disease

## 2024-03-31 VITALS — BP 110/70 | HR 62 | Temp 98.2°F | Ht 63.0 in | Wt 146.4 lb

## 2024-03-31 DIAGNOSIS — R918 Other nonspecific abnormal finding of lung field: Secondary | ICD-10-CM | POA: Diagnosis not present

## 2024-03-31 NOTE — Patient Instructions (Addendum)
 Multiple lung nodules - stable from 06/2023-11/2023 Tracheal micronodules --Reviewed CT Chest 06/2023 and 12/13/23. Stable subcentimeter nodules. Tracheal nodules previously reported to be stable but will need follow-up --Previously discussed with [facility] pulmonologist and recommend surveillance at this time

## 2024-03-31 NOTE — Progress Notes (Addendum)
 Subjective:   PATIENT ID: Dana [name] GENDER: female DOB: [birth year], MRN: [id number]  Chief Complaint  Patient presents with   Lung Mass    Follow up    Reason for Visit: Follow-up for abnormal CT scan  Ms. Ruvi Fullenwider is a 63 year old female never smoker with allergic rhinitis, GERD, hx BPPV, migraine, insomnia who presents for follow-up CT scan.  Initial consult She reports diagnosis of covid in May 2022 complicated by long hauler. Since then has not been able to pickleball. She has started walking again, going 3 miles a day. Denies fatigue, unintentional weight loss. Occasional night sweats. She does have cough and sore throat recently but thought this to be viral. No cough, shortness of breath or wheezing at baseline. She reports a history of abnormal CT scans since 2022 that have monitored a tracheal nodules and lung nodules. She was referred by PCP for additional Pulmonary work-up if indicated.  07/14/23 Since our last visit she reports that she remains asymptomatic from a respiratory standpoint. She is walking and working with a trainer two days a week. No limitations in activity. Able to go up and down the stairs. Planning for a cruise soon. Denies history cold sores. Occasional has bronchitis with sputum production.  03/31/24 Since our last visit she is asymptomatic. Denies shortness of breath, cough or wheezing. Active at baseline with no limitation in activity. Had CT coronary scan in July. Had ANCA lab obtained by [facility] however no results were faxed.  Social History: Never smoker  Past Medical History:  Diagnosis Date   Abnormality of gait 07/18/2015   Adhesive capsulitis of left shoulder 12/23/2017   Documented by her PCP and treated by Norleen Arthur   AR (allergic rhinitis) 11/08/2011   BPPV (benign paroxysmal positional vertigo) 07/31/2015   Chest pain, precordial 09/30/2017   Disturbance in sleep behavior 12/30/2017   DOE (dyspnea on exertion) 09/29/2017    Family history of early CAD 09/29/2017   GERD (gastroesophageal reflux disease)    Headache    Insomnia 11/08/2011   Leg length discrepancy 12/30/2017   Menopause 11/08/2011   2009    Migraine with aura and without status migrainosus, not intractable 07/31/2015   Nasal septal deviation 11/08/2011   Nonallopathic lesion of cervical region 11/03/2019   Sternocleidomastoid muscle tenderness 12/30/2017   Vertigo      Family History  Problem Relation Age of Onset   Hypertension Mother    Hypertension Father    Cancer Father    Diabetes Father    Coronary artery disease Father        History of CABG   Parkinsonism Paternal Grandfather    High Cholesterol Brother 86     Social History   Occupational History   Not on file  Tobacco Use   Smoking status: Never    Passive exposure: Past   Smokeless tobacco: Never   Tobacco comments:    Smoked briefly while in highschool.  Vaping Use   Vaping status: Never Used  Substance and Sexual Activity   Alcohol use: Yes    Comment: occasionally   Drug use: Never   Sexual activity: Not on file    Allergies  Allergen Reactions   Prednisone     Feels like skin crawls     Outpatient Medications Prior to Visit  Medication Sig Dispense Refill   cholecalciferol (VITAMIN D3) 25 MCG (1000 UNIT) tablet Take 2,000 Units by mouth daily.     loratadine (CLARITIN)  10 MG tablet Take 10 mg by mouth daily.     Multiple Vitamin (MULTIVITAMIN WITH MINERALS) TABS tablet Take 1 tablet by mouth daily.     sertraline (ZOLOFT) 25 MG tablet Take 50 mg by mouth daily. 1.5 tabs daily     SUMAtriptan  (IMITREX ) 100 MG tablet Take 1 tablet earliest onset of migraine.  May repeat once in 2 hours if headache persists or recurs.  Do not exceed 2 tablets in 24 hours 10 tablet 5   No facility-administered medications prior to visit.    Review of Systems  Constitutional:  Negative for chills, diaphoresis, fever, malaise/fatigue and weight loss.  HENT:   Negative for congestion.   Respiratory:  Negative for cough, hemoptysis, sputum production, shortness of breath and wheezing.   Cardiovascular:  Negative for chest pain, palpitations and leg swelling.     Objective:   Vitals:   03/31/24 1033  BP: 110/70  Pulse: 62  Temp: 98.2 F (36.8 C)  TempSrc: Oral  SpO2: 98%  Weight: 146 lb 6.4 oz (66.4 kg)  Height: 5' 3 (1.6 m)    SpO2: 98 %  Physical Exam: General: Well-appearing, no acute distress HENT: [place], AT Eyes: EOMI, no scleral icterus Respiratory: Clear to auscultation bilaterally.  No crackles, wheezing or rales Cardiovascular: RRR, -M/R/G, no JVD Extremities:-Edema,-tenderness Neuro: AAO x4, CNII-XII grossly intact Psych: Normal mood, normal affect  Data Reviewed:  Imaging: CXR 01/07/21 - Hyperinflation  CTA 01/03/21 at [facility] (report only) - IMPRESSION: 1. No acute pulmonary embolus. 2. Nodularity about the mid and distal trachea is indeterminate. Polyps can have this appearance debris also a consideration. 3. There are a few linear nodular opacities in the peripheral lungs, the largest measures 6 mm in the right lower lobe.   CT Chest 07/09/21 at [facility] (report only) - The small nodules in the right lung, mostly subpleural are stable.  Per Fleischner Society guidelines, recommend follow-up CT chest at 18-24 months from the initial CT scan (January 03, 2021.)   Micronodular appearance along the anterior wall of the trachea is again noted and unchanged. This is of uncertain significance. Tracheal papillomatosis is within the differential diagnosis.  Other tracheal diseases which spare the posterior trachea can include relapsing polychondritis and tracheobronchopathia osteochondroplastica, though there is no associated wall thickening which is usually seen with these 2 entities.   CT Chest 07/10/22 at [facility] (report only) - Stable 6 mm and small pulmonary nodules. No new nodules. Micronodular appearance of the anterior trachea  without significant change or associated soft tissue.  CT Chest 07/02/23: Multiple subcentimeter pulmonary nodules bilaterally with largest 5 mm in RLL  CT Cardiac 12/13/23 - Unchanged pulmonary nodules  PFT: CPET 02/18/23 Excellent function capacity. 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 Reviewed CT imaging as noted below. Nodules remain stable. Addressed questions and concerns. Radiology previously commented  regarding micronodular appearance along the anterior wall of the trachea ensure findings are stable without any associated inflammation. Suspect this is likely benign. Recommend surveillance of tracheal nodules and subcentimeter lung nodules <10mm.   Multiple lung nodules - stable from 06/2023-11/2023 Tracheal micronodules - low suspicion for systemic process or active issue in absence of associated inflammation and absence of symptoms. Ddx include tracheal papillomatosis. Low suspicion for relapsing polychondritis. Exam without external cartilaginous involvement, join pain or rashes. Tracheobronchopathia osteochondroplastica with mild evidence of calcifications is considered. With sparing of the posterior tracheal wall, doubt amyloid, sarcoid and lower suspicion for carcinomas.   --Reviewed CT Chest 06/2023 and 12/13/23. Stable subcentimeter nodules. Tracheal nodules previously reported to be stable but will need follow-up --Previously discussed with [facility] pulmonologist and recommend  surveillance at this time --Consider flexible bronchoscopy with moderate sedation vs referral to tertiary center if worsening  Health Maintenance Immunization History  Administered Date(s) Administered   Influenza Split 04/04/2012   Influenza,inj,Quad PF,6+ Mos 04/16/2014, 04/06/2015, 03/30/2017, 03/30/2017   Influenza,trivalent, recombinat, inj, PF 04/04/2012   Influenza-Unspecified 04/06/2015   PFIZER(Purple Top)SARS-COV-2 Vaccination 09/01/2019, 09/25/2019   Tdap 08/28/2015   CT Lung Screen - not qualified. Never smoker  Orders Placed This Encounter  Procedures   CT Chest Wo Contrast    Standing Status:   Future    Expiration Date:   03/31/2025    Scheduling Instructions:     Schedule in Feb 2026.    Preferred imaging location?:   [facility]  No orders of the defined types were placed in this encounter.   Return in about 3 months (around 07/01/2024) for after CT scan.   I have spent a total time of 30-minutes on the day of the appointment including chart review, data review, collecting history, coordinating care and discussing medical diagnosis and plan with the patient/family. Past medical history, allergies, medications were reviewed. Pertinent imaging, labs and tests included in this note have been reviewed and interpreted independently by me.  Sai Moura Slater Staff, MD [facility] 03/31/2024 12:04 PM

## 2024-05-12 ENCOUNTER — Encounter: Payer: Self-pay | Admitting: Cardiology

## 2024-06-16 ENCOUNTER — Encounter (HOSPITAL_BASED_OUTPATIENT_CLINIC_OR_DEPARTMENT_OTHER): Payer: Self-pay | Admitting: Pulmonary Disease

## 2024-06-21 ENCOUNTER — Inpatient Hospital Stay: Admission: RE | Admit: 2024-06-21 | Discharge: 2024-06-21 | Attending: Pulmonary Disease

## 2024-06-21 DIAGNOSIS — R918 Other nonspecific abnormal finding of lung field: Secondary | ICD-10-CM

## 2024-06-26 ENCOUNTER — Ambulatory Visit (HOSPITAL_BASED_OUTPATIENT_CLINIC_OR_DEPARTMENT_OTHER): Payer: Self-pay | Admitting: Pulmonary Disease

## 2024-06-27 ENCOUNTER — Ambulatory Visit (HOSPITAL_BASED_OUTPATIENT_CLINIC_OR_DEPARTMENT_OTHER): Admitting: Pulmonary Disease

## 2024-07-26 ENCOUNTER — Ambulatory Visit (HOSPITAL_BASED_OUTPATIENT_CLINIC_OR_DEPARTMENT_OTHER): Admitting: Pulmonary Disease

## 2024-11-20 ENCOUNTER — Ambulatory Visit: Admitting: Neurology
